# Patient Record
Sex: Male | Born: 1980 | Race: White | Hispanic: No | Marital: Married | Smoking: Former smoker
Health system: Southern US, Community
[De-identification: ages and names within clinical notes are randomized; demographics above are authoritative.]

## PROBLEM LIST (undated history)

## (undated) DIAGNOSIS — F32A Depression, unspecified: Secondary | ICD-10-CM

## (undated) DIAGNOSIS — R569 Unspecified convulsions: Secondary | ICD-10-CM

## (undated) DIAGNOSIS — G43909 Migraine, unspecified, not intractable, without status migrainosus: Secondary | ICD-10-CM

## (undated) DIAGNOSIS — F329 Major depressive disorder, single episode, unspecified: Secondary | ICD-10-CM

## (undated) HISTORY — DX: Unspecified convulsions: R56.9

## (undated) HISTORY — DX: Migraine, unspecified, not intractable, without status migrainosus: G43.909

## (undated) HISTORY — DX: Major depressive disorder, single episode, unspecified: F32.9

## (undated) HISTORY — PX: REFRACTIVE SURGERY: SHX103

## (undated) HISTORY — DX: Depression, unspecified: F32.A

---

## 2006-12-08 ENCOUNTER — Emergency Department (HOSPITAL_COMMUNITY): Admission: EM | Admit: 2006-12-08 | Discharge: 2006-12-08 | Payer: Self-pay | Admitting: Emergency Medicine

## 2009-03-18 ENCOUNTER — Emergency Department (HOSPITAL_COMMUNITY): Admission: EM | Admit: 2009-03-18 | Discharge: 2009-03-19 | Payer: Self-pay | Admitting: Emergency Medicine

## 2011-06-04 ENCOUNTER — Emergency Department (HOSPITAL_COMMUNITY): Payer: Managed Care, Other (non HMO)

## 2011-06-04 ENCOUNTER — Other Ambulatory Visit: Payer: Self-pay

## 2011-06-04 ENCOUNTER — Emergency Department (HOSPITAL_COMMUNITY)
Admission: EM | Admit: 2011-06-04 | Discharge: 2011-06-05 | Disposition: A | Discharge status: Home / Self Care | Payer: Managed Care, Other (non HMO) | Attending: Emergency Medicine | Admitting: Emergency Medicine

## 2011-06-04 DIAGNOSIS — R569 Unspecified convulsions: Secondary | ICD-10-CM | POA: Insufficient documentation

## 2011-06-04 DIAGNOSIS — IMO0002 Reserved for concepts with insufficient information to code with codable children: Secondary | ICD-10-CM | POA: Insufficient documentation

## 2011-06-04 DIAGNOSIS — R079 Chest pain, unspecified: Secondary | ICD-10-CM | POA: Insufficient documentation

## 2011-06-04 DIAGNOSIS — X58XXXA Exposure to other specified factors, initial encounter: Secondary | ICD-10-CM | POA: Insufficient documentation

## 2011-06-04 NOTE — ED Notes (Addendum)
Pt states he thinks he had a seizure around 9 pm. Pt states he was sitting talking to his daughter, than he woke up on the floor, states he hit his head. Pt hx: of seizure last one 20 years ago), and epilepsy. Pt also c/o intermittent substernal CP that feels like pressure. Tender to palpation. Lightheadness, and blurry vision. Pain on left shoulder arm, and leg

## 2011-06-04 NOTE — ED Provider Notes (Signed)
History     CSN: [id number]  Arrival date & time 06/04/11  2158   First MD Initiated Contact with Patient 06/04/11 2301      Chief Complaint  Patient presents with  . Seizures    (Consider location/radiation/quality/duration/timing/severity/associated sxs/prior treatment) Patient is a 31 y.o. male presenting with seizures. The history is provided by the patient and the spouse. The history is limited by the condition of the patient. No language interpreter was used.  Seizures  This is a recurrent (Had epilepsy as a child, and was seizure free and off meds for 20 years) problem. The current episode started 1 to 2 hours ago. The problem has been resolved. There was 1 seizure. The most recent episode lasted 30 to 120 seconds. Associated symptoms include chest pain. Pertinent negatives include no headaches. Characteristics include rhythmic jerking and loss of consciousness. thinks it may have been kerking as left side hurts and he was sitting on the floor and awoke 180 degrree turned around and under bed with abrasion to right temple Focality: unknown as was unwitnessed. unknown There has been no fever. Associated symptoms comments: Chest sore in one spot. Meds prior to arrival: none.    No past medical history on file.  No past surgical history on file.  No family history on file.  History  Substance Use Topics  . Smoking status: Not on file  . Smokeless tobacco: Not on file  . Alcohol Use: Not on file      Review of Systems  Constitutional: Negative.   HENT: Negative.   Eyes: Negative.   Respiratory: Negative for shortness of breath.   Cardiovascular: Positive for chest pain.  Gastrointestinal: Negative.   Genitourinary: Negative.   Musculoskeletal: Negative.   Skin: Negative.   Neurological: Positive for seizures and loss of consciousness. Negative for dizziness, facial asymmetry, weakness, light-headedness, numbness and headaches.  Hematological: Negative.     Psychiatric/Behavioral: Negative.     Allergies  Shellfish allergy and Penicillins  Home Medications  No current outpatient prescriptions on file.  BP 131/82  Pulse 61  Temp(Src) 98 F (36.7 C) (Oral)  Resp 18  SpO2 94%  Physical Exam  Constitutional: He is oriented to person, place, and time. He appears well-developed and well-nourished. No distress.  HENT:  Right Ear: No hemotympanum.  Left Ear: No hemotympanum.  Mouth/Throat: Oropharynx is clear and moist. No oropharyngeal exudate.       abrasion linear right temporal area  Eyes: Conjunctivae and EOM are normal. Pupils are equal, round, and reactive to light.  Neck: Normal range of motion. Neck supple.  Cardiovascular: Normal rate and regular rhythm.   Pulmonary/Chest: Effort normal and breath sounds normal. He has no wheezes. He has no rales. He exhibits tenderness.       Left upper chest  Abdominal: Soft. Bowel sounds are normal. There is no tenderness. There is no rebound and no guarding.  Musculoskeletal: Normal range of motion. He exhibits no edema and no tenderness.  Neurological: He is alert and oriented to person, place, and time. He has normal reflexes. No cranial nerve deficit.  Skin: Skin is warm and dry.  Psychiatric: He has a normal mood and affect.    ED Course  Procedures (including critical care time)   Labs Reviewed  CBC  DIFFERENTIAL  URINALYSIS, ROUTINE W REFLEX MICROSCOPIC   No results found.   No diagnosis found.  PERC negative.  No long car trips or plane trips. No swelling nor pain in the  lower extremities.  Very low risk for PE Suspect the chest tenderness is due to hitting the underside of the bed with his chest while seizing.    MDM  Patient is informed he cannot drive for 6 month or until cleared by neurology 117 case d/w Dr. Roseanne Reno of neuro, no meds at this time.  No driving as per EDP prior instructions and follow up in the office for ongoing care.    Patient to return  immediately for chest pain shortness of breath, altered level of consciousness or seizures.  Patient and wife verbal understanding and agree to follow up       Date: 06/05/2011  Rate: 53  Rhythm: sinus bradycardia  QRS Axis: normal  Intervals: normal  ST/T Wave abnormalities: normal  Conduction Disutrbances:none  Narrative Interpretation:   Old EKG Reviewed: changes noted    Tony Petrow Smitty Cords, MD 06/05/11 8295

## 2011-06-04 NOTE — ED Notes (Signed)
Pt states that he has a history of seizures last one 20 years ago when he was 11. Pt was on phenobaritol for a few years and has been off for a few years as well. Pt states that this evening he was sitting in a chair in his daughter's room and felt like he was going to pass out. Pt states that he woke up and was under his daughter's bed. Pt states that his left side is hurting and pt has a new scrap to his right side of his face. Pt is alert and oriented and able to follow commands and move extremities.

## 2011-06-05 ENCOUNTER — Other Ambulatory Visit: Payer: Self-pay

## 2011-06-05 LAB — CBC
HCT: 40.2 % (ref 39.0–52.0)
Hemoglobin: 14.3 g/dL (ref 13.0–17.0)
MCH: 29.8 pg (ref 26.0–34.0)
MCHC: 35.6 g/dL (ref 30.0–36.0)
MCV: 83.8 fL (ref 78.0–100.0)
RBC: 4.8 MIL/uL (ref 4.22–5.81)

## 2011-06-05 LAB — DIFFERENTIAL
Eosinophils Absolute: 0.2 10*3/uL (ref 0.0–0.7)
Eosinophils Relative: 2 % (ref 0–5)
Lymphocytes Relative: 31 % (ref 12–46)
Lymphs Abs: 2.9 10*3/uL (ref 0.7–4.0)
Monocytes Absolute: 0.6 10*3/uL (ref 0.1–1.0)

## 2011-06-05 LAB — URINALYSIS, ROUTINE W REFLEX MICROSCOPIC
Glucose, UA: NEGATIVE mg/dL
Hgb urine dipstick: NEGATIVE
Ketones, ur: NEGATIVE mg/dL
Leukocytes, UA: NEGATIVE
pH: 6.5 (ref 5.0–8.0)

## 2011-06-05 LAB — POCT I-STAT, CHEM 8
Chloride: 104 mEq/L (ref 96–112)
Creatinine, Ser: 0.8 mg/dL (ref 0.50–1.35)
Glucose, Bld: 95 mg/dL (ref 70–99)
Hemoglobin: 14.6 g/dL (ref 13.0–17.0)
Potassium: 4 mEq/L (ref 3.5–5.1)
Sodium: 143 mEq/L (ref 135–145)

## 2011-06-05 LAB — POCT I-STAT TROPONIN I

## 2011-06-05 MED ORDER — TETANUS-DIPHTH-ACELL PERTUSSIS 5-2.5-18.5 LF-MCG/0.5 IM SUSP
0.5000 mL | Freq: Once | INTRAMUSCULAR | Status: AC
  Administered 2011-06-05: 0.5 mL via INTRAMUSCULAR
  Filled 2011-06-05: qty 0.5

## 2011-06-05 NOTE — Discharge Instructions (Signed)
Driving and Equipment Restrictions Some medical problems make it dangerous to drive, ride a bike, or use machines. Some of these problems are:  A hard blow to the head (concussion).   Passing out (fainting).   Twitching and shaking (seizures).   Low blood sugar.   Taking medicine to help you relax (sedatives).   Taking pain medicines.   Wearing an eye patch.   Wearing splints. This can make it hard to use parts of your body that you need to drive safely.  HOME CARE   Do not drive until your doctor says it is okay.   Do not use machines until your doctor says it is okay.  You may need a form signed by your doctor (medical release) before you can drive again. You may also need this form before you do other tasks where you need to be fully alert. MAKE SURE YOU:  Understand these instructions.   Will watch your condition.   Will get help right away if you are not doing well or get worse.  Document Released: 04/07/2004 Document Revised: 02/17/2011 Document Reviewed: 07/08/2009 Exit[employer] Patient Information 2012 [employer].Epilepsy People with epilepsy have times when they shake and jerk uncontrollably (seizures). This happens when there is a sudden change in brain function. Epilepsy may have many possible causes. Anything that disturbs the normal pattern of brain cell activity can lead to seizures. HOME CARE   Listen to your doctor about driving and safety during normal activities.   Only take medicine as told by your doctor.   Take blood tests as told by your doctor.   Tell the people you live and work with that you have seizures. Make sure they know how to help you. They should:   Cushion your head and body.   Turn you on your side.   Not restrain you.   Not place anything inside your mouth.   Call for local emergency medical help if there is any question about what has happened.   Write down when your seizures happen and what you remember about each seizure.  Write down anything you think may have caused the seizure to happen (trigger).   Keep all follow-up visits with your doctor. This is very important.  GET HELP RIGHT AWAY IF:   You get an infection or start to feel sick. You may have more seizures when you are sick.   You are having seizures more often.   Your seizure pattern is changing.   A seizure does not stop after a few seconds or minutes.   A seizure causes you to have trouble breathing.   A seizure gives you a very bad headache.   A seizure makes you unable to speak or use a part of your body.  MAKE SURE YOU:   Understand these instructions.   Will watch your condition.   Will get help right away if you are not doing well or get worse.  Document Released: 12/26/2008 Document Revised: 02/17/2011 Document Reviewed: 12/26/2008 Exit[employer] Patient Information 2012 [employer]. 

## 2011-06-05 NOTE — ED Notes (Signed)
Pt told that he could not drive for 6 month due to seizure like activity until Neurologist clears pt.

## 2012-07-04 ENCOUNTER — Telehealth: Payer: Self-pay | Admitting: *Deleted

## 2012-07-04 NOTE — Telephone Encounter (Signed)
Message copied by Harlon Flor Breaker Springer L on Wed Jul 04, 2012 10:54 AM ------      Message from: Arther Abbott B      Created: Wed Jul 04, 2012 10:35 AM      Contact: Pt [name]       Pt Tony Valentine called has an Appt with GNP Darrol Angel on 08/01/12 for OV. He starts a new job May 1st and will not have insurance coverage. He needs to see if he can get an appt. With the Dr. before the end of the month. I did not cancel the other appt yet and I did let him know that to see what someone else can help him with. Thanks  ------

## 2012-07-04 NOTE — Telephone Encounter (Signed)
sched sooner appt

## 2012-07-06 ENCOUNTER — Ambulatory Visit (INDEPENDENT_AMBULATORY_CARE_PROVIDER_SITE_OTHER): Payer: Managed Care, Other (non HMO) | Admitting: Nurse Practitioner

## 2012-07-06 ENCOUNTER — Encounter: Payer: Self-pay | Admitting: Nurse Practitioner

## 2012-07-06 VITALS — BP 118/69 | HR 82 | Ht 69.5 in | Wt 180.0 lb

## 2012-07-06 DIAGNOSIS — G40209 Localization-related (focal) (partial) symptomatic epilepsy and epileptic syndromes with complex partial seizures, not intractable, without status epilepticus: Secondary | ICD-10-CM | POA: Insufficient documentation

## 2012-07-06 MED ORDER — LAMOTRIGINE 100 MG PO TABS: 150.0000 mg | ORAL_TABLET | Freq: Two times a day (BID) | ORAL | Status: DC

## 2012-07-06 NOTE — Progress Notes (Signed)
HPI: Tony Valentine Age returns for followup at her last visit 02/01/2012. He has a history of complex partial seizure disorder and is currently on lamotrigine 150 twice a day Date of last seizure November 2013. Denies staring spells, confusion, sleep disturbances, lapses of time, headache and bowel and bladder incontinence. 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 His EEG was normal. He is tolerating his lamotrigine without side effects.   ROS:  - blurred vision, headache  Physical Exam General: well developed, well nourished, seated, in no evident distress Head: head normocephalic and atraumatic. Oropharynx benign Neck: supple with no carotid or supraclavicular bruits Cardiovascular: regular rate and rhythm, no murmurs  Neurologic Exam Mental Status: Awake and fully alert. Oriented to place and time. Recent and remote memory intact. Attention span, concentration and fund of knowledge appropriate. Mood and affect appropriate.  Cranial Nerves: Fundoscopic exam reveals sharp disc margins. Pupils equal, briskly reactive to light. Extraocular movements full without nystagmus. Visual fields full to confrontation. Hearing intact and symmetric to finger snap. Facial sensation intact. Face, tongue, palate move normally and symmetrically. Neck flexion and extension normal.  Motor: Normal bulk and tone. Normal strength in all tested extremity muscles. Sensory.: intact to touch and pinprick and vibratory.  Coordination: Rapid alternating movements normal in all extremities. Finger-to-nose and heel-to-shin performed accurately bilaterally. Gait and Station: Arises from chair without difficulty. Stance is normal. Gait demonstrates normal stride length and balance . Able to heel, toe and tandem walk without difficulty.  Reflexes: 2+ and symmetric. Toes downgoing.     ASSESSMENT: Complex partial seizure  disorder doing well on lamotrigine 150 mg twice daily.  PLAN: Continue Lamictal 150 twice a day Will renew for  three-month prescription today.  Patient loses insurance on [id number]. Given information on patient assistance Followup in 6 months Call for any seizure activity  Nilda Riggs, GNP-BC APRN

## 2012-07-06 NOTE — Patient Instructions (Addendum)
Continue Lamictal 150 twice a day Will renew for her three-month prescription today Followup in 6 months Call for any seizure activity

## 2012-08-01 ENCOUNTER — Ambulatory Visit: Payer: Self-pay | Admitting: Nurse Practitioner

## 2012-11-05 ENCOUNTER — Telehealth: Payer: Self-pay | Admitting: Neurology

## 2012-11-05 MED ORDER — LAMOTRIGINE 100 MG PO TABS: 150.0000 mg | ORAL_TABLET | Freq: Two times a day (BID) | ORAL | Status: DC

## 2012-11-05 NOTE — Telephone Encounter (Signed)
Rx sent 

## 2012-12-05 ENCOUNTER — Other Ambulatory Visit: Payer: Self-pay | Admitting: Neurology

## 2013-01-07 ENCOUNTER — Encounter: Payer: Self-pay | Admitting: Nurse Practitioner

## 2013-01-07 ENCOUNTER — Ambulatory Visit (INDEPENDENT_AMBULATORY_CARE_PROVIDER_SITE_OTHER): Payer: PRIVATE HEALTH INSURANCE | Admitting: Nurse Practitioner

## 2013-01-07 ENCOUNTER — Encounter (INDEPENDENT_AMBULATORY_CARE_PROVIDER_SITE_OTHER): Payer: Self-pay

## 2013-01-07 VITALS — BP 112/73 | HR 56 | Ht 69.0 in | Wt 186.0 lb

## 2013-01-07 DIAGNOSIS — G40209 Localization-related (focal) (partial) symptomatic epilepsy and epileptic syndromes with complex partial seizures, not intractable, without status epilepticus: Secondary | ICD-10-CM

## 2013-01-07 DIAGNOSIS — Z79899 Other long term (current) drug therapy: Secondary | ICD-10-CM

## 2013-01-07 DIAGNOSIS — Z5181 Encounter for therapeutic drug level monitoring: Secondary | ICD-10-CM | POA: Insufficient documentation

## 2013-01-07 NOTE — Progress Notes (Signed)
[facility]  PATIENT: Irineo Gaulin DOB: October 16, 1980   REASON FOR VISIT: Followup for seizure disorder  HISTORY OF PRESENT ILLNESS:[name], 32 year old male returns for followup. He has a history of complex partial seizure disorder and is currently on lamotrigine 150 twice a day Date of last seizure November 2013 however he does report occasional dizziness. In further questioning he does not take his medication every 12 hours sometimes he may be 3 hours late. Discussed the importance of taking as directed. Denies staring spells, confusion, sleep disturbances, lapses of time, headache and bowel and bladder incontinence. 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 His EEG was normal. He is tolerating his lamotrigine without side effects.    REVIEW OF SYSTEMS: Full 14 system review of systems performed and notable only for:  Constitutional: N/A  Cardiovascular: N/A  Ear/Nose/Throat: N/A  Skin: N/A  Eyes: N/A  Respiratory: N/A  Gastroitestinal: N/A  Hematology/Lymphatic: N/A  Endocrine: N/A Musculoskeletal:N/A  Allergy/Immunology: N/A  Neurological: Memory loss, headache, dizziness  Psychiatric: Depression   ALLERGIES: Allergies  Allergen Reactions  . Amoxil [Amoxicillin]   . Shellfish Allergy Anaphylaxis  . Penicillins Hives    HOME MEDICATIONS: Outpatient Prescriptions Prior to Visit  Medication Sig Dispense Refill  . lamoTRIgine (LAMICTAL) 100 MG tablet TAKE 1 AND 1/2 TABLETS BY MOUTH 2 TIMES DAILY.  270 tablet  1   No facility-administered medications prior to visit.    PAST MEDICAL HISTORY: Past Medical History  Diagnosis Date  . Seizures   . Migraine     PAST SURGICAL HISTORY: Past Surgical History  Procedure Laterality Date  . Refractive surgery      FAMILY HISTORY: History reviewed. No pertinent family history.  SOCIAL  HISTORY: History   Social History  . Marital Status: Married    Spouse Name: Florentina Addison    Number of Children: 2  . Years of Education: 12   Occupational History  .      [employer]   Social History Main Topics  . Smoking status: Current Every Day Smoker    Types: Cigarettes  . Smokeless tobacco: Current User    Types: Chew  . Alcohol Use: 0.0 oz/week     Comment: every once in a while   . Drug Use: No  . Sexual Activity: Not on file   Other Topics Concern  . Not on file   Social History Narrative   Patient lives at home with his wife Florentina Addison) Patient works full time.   Right handed.   Education. Patient is in college now.   Caffeine- two to four cups daily.     PHYSICAL EXAM  Filed Vitals:   01/07/13 0818  BP: 112/73  Pulse: 56  Height: 5\' 9"  (1.753 m)  Weight: 186 lb (84.369 kg)   Body mass index is 27.45 kg/(m^2).  Generalized: Well developed, in no acute distress  Head: normocephalic and atraumatic,. Oropharynx benign  Neck: Supple, no carotid bruits  Cardiac: Regular rate rhythm, no murmur  Musculoskeletal: No deformity   Neurological examination   Mentation: Alert oriented to time, place, history taking. Follows all commands speech and language fluent  Cranial nerve II-XII: Pupils were equal round reactive to light extraocular movements were full, visual field were full on confrontational test. Facial sensation and strength were normal. hearing was intact to finger rubbing bilaterally. Uvula tongue midline. head turning and shoulder  shrug and were normal and symmetric.Tongue protrusion into cheek strength was normal. Motor: normal bulk and tone, full strength in the BUE, BLE, fine finger movements normal, no pronator drift. No focal weakness Coordination: finger-nose-finger, heel-to-shin bilaterally, no dysmetria Reflexes: Brachioradialis 2/2, biceps 2/2, triceps 2/2, patellar 2/2, Achilles 2/2, plantar responses were flexor bilaterally. Gait and  Station: Rising up from seated position without assistance, normal stance,  moderate stride, good arm swing, smooth turning, able to perform tiptoe, and heel walking without difficulty. Tandem gait stable  DIAGNOSTIC DATA (LABS, IMAGING, TESTING) -None to review  ASSESSMENT AND PLAN  32 y.o. year old male  has a past medical history of Seizures and Migraine. here to followup for seizure disorder. He is currently on Lamictal 150 mg twice daily. He does not take this medication 12 hours apart all the time. Instructed the importance of this.  Pt to continue Lamictal at current dose Will get labs in the am, Lamictal level, CBC, CMP FU in 6 months  Nilda Riggs, [facility], [facility], APRN  [facility] [address] [place], [place] [address] [phone]

## 2013-01-07 NOTE — Patient Instructions (Addendum)
Pt to continue Lamictal at current dose Will get labs in the am FU in 6 months

## 2013-01-08 ENCOUNTER — Other Ambulatory Visit (INDEPENDENT_AMBULATORY_CARE_PROVIDER_SITE_OTHER): Payer: Self-pay

## 2013-01-08 DIAGNOSIS — Z0289 Encounter for other administrative examinations: Secondary | ICD-10-CM

## 2013-01-08 DIAGNOSIS — G40209 Localization-related (focal) (partial) symptomatic epilepsy and epileptic syndromes with complex partial seizures, not intractable, without status epilepticus: Secondary | ICD-10-CM

## 2013-01-08 DIAGNOSIS — Z79899 Other long term (current) drug therapy: Secondary | ICD-10-CM

## 2013-01-09 LAB — COMPREHENSIVE METABOLIC PANEL
ALT: 14 IU/L (ref 0–44)
Albumin/Globulin Ratio: 2.3 (ref 1.1–2.5)
BUN: 16 mg/dL (ref 6–20)
Calcium: 9.7 mg/dL (ref 8.7–10.2)
GFR calc non Af Amer: 79 mL/min/{1.73_m2} (ref >59)
Glucose: 100 mg/dL — ABNORMAL HIGH (ref 65–99)
Potassium: 4.1 mmol/L (ref 3.5–5.2)
Total Protein: 6.6 g/dL (ref 6.0–8.5)

## 2013-01-09 LAB — CBC WITH DIFFERENTIAL/PLATELET
Basophils Absolute: 0 10*3/uL (ref 0.0–0.2)
Eosinophils Absolute: 0.1 10*3/uL (ref 0.0–0.4)
HCT: 42.4 % (ref 37.5–51.0)
Immature Grans (Abs): 0 10*3/uL (ref 0.0–0.1)
Immature Granulocytes: 0 %
MCHC: 34.9 g/dL (ref 31.5–35.7)
Monocytes Absolute: 0.4 10*3/uL (ref 0.1–0.9)
Monocytes: 6 %
RDW: 14 % (ref 12.3–15.4)

## 2013-01-09 LAB — LAMOTRIGINE LEVEL: Lamotrigine Lvl: 6.6 ug/mL (ref 2.0–20.0)

## 2013-01-10 NOTE — Progress Notes (Signed)
Quick Note:  Shared good labs with patient thru VM message. ______

## 2013-03-06 ENCOUNTER — Encounter: Payer: Self-pay | Admitting: Nurse Practitioner

## 2013-06-08 ENCOUNTER — Other Ambulatory Visit: Payer: Self-pay | Admitting: Neurology

## 2013-07-08 ENCOUNTER — Ambulatory Visit: Payer: PRIVATE HEALTH INSURANCE | Admitting: Nurse Practitioner

## 2013-07-08 ENCOUNTER — Other Ambulatory Visit: Payer: Self-pay | Admitting: Neurology

## 2013-07-11 ENCOUNTER — Encounter: Payer: Self-pay | Admitting: Nurse Practitioner

## 2013-07-11 ENCOUNTER — Encounter (INDEPENDENT_AMBULATORY_CARE_PROVIDER_SITE_OTHER): Payer: Self-pay

## 2013-07-11 ENCOUNTER — Ambulatory Visit (INDEPENDENT_AMBULATORY_CARE_PROVIDER_SITE_OTHER): Payer: PRIVATE HEALTH INSURANCE | Admitting: Nurse Practitioner

## 2013-07-11 VITALS — BP 112/65 | HR 64 | Ht 67.0 in | Wt 177.0 lb

## 2013-07-11 DIAGNOSIS — G40209 Localization-related (focal) (partial) symptomatic epilepsy and epileptic syndromes with complex partial seizures, not intractable, without status epilepticus: Secondary | ICD-10-CM

## 2013-07-11 MED ORDER — LAMOTRIGINE 100 MG PO TABS: 150.0000 mg | ORAL_TABLET | Freq: Two times a day (BID) | ORAL | Status: DC

## 2013-07-11 NOTE — Progress Notes (Signed)
[facility]  PATIENT: Tony LoserChristopher Valentine DOB: 06/01/1980   REASON FOR VISIT: follow up for seizure disorder   HISTORY OF PRESENT ILLNESS: [name] 33 year old male returns for followup. He was last seen in the office 01/07/2013. He has a complex partial seizure disorder, last seizure occurred in November 2013. He is currently on Lamictal 150 mg twice daily. He denies any staring spells, confusion, sleep disturbances lapses of time, etc. He returns for reevaluation   HISTORY: of complex partial seizure disorder and is currently on lamotrigine 150 twice a day Date of last seizure November 2013 however he does report occasional dizziness. In further questioning he does not take his medication every 12 hours sometimes he may be 3 hours late. Discussed the importance of taking as directed. Denies staring spells, confusion, sleep disturbances, lapses of time, headache and bowel and bladder incontinence. 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 His EEG was normal. He is tolerating his lamotrigine without side effects.   REVIEW OF SYSTEMS: Full 14 system review of systems performed and notable only for those listed, all others are neg:  Constitutional: N/A  Cardiovascular: N/A  Ear/Nose/Throat: N/A  Skin: N/A  Eyes: N/A  Respiratory: N/A  Gastroitestinal: N/A  Hematology/Lymphatic: N/A  Endocrine: N/A Musculoskeletal:N/A  Allergy/Immunology: N/A  Neurological: N/A Psychiatric: N/A   ALLERGIES: Allergies  Allergen Reactions  . Amoxil [Amoxicillin]   . Shellfish Allergy Anaphylaxis  . Penicillins Hives    HOME MEDICATIONS: Outpatient Prescriptions Prior to Visit  Medication Sig Dispense Refill  . lamoTRIgine (LAMICTAL) 100 MG tablet TAKE 1 AND 1/2 TABLETS BY MOUTH 2 TIMES DAILY.  270 tablet  0   No facility-administered medications prior to visit.     PAST MEDICAL HISTORY: Past Medical History  Diagnosis Date  . Seizures   . Migraine     PAST SURGICAL HISTORY: Past Surgical History  Procedure Laterality Date  . Refractive surgery      FAMILY HISTORY: History reviewed. No pertinent family history.  SOCIAL HISTORY: History   Social History  . Marital Status: Married    Spouse Name: Tony Valentine    Number of Children: 2  . Years of Education: 12   Occupational History  .      [employer]   Social History Main Topics  . Smoking status: Former Smoker    Types: Cigarettes  . Smokeless tobacco: Current User    Types: Chew  . Alcohol Use: 0.0 oz/week     Comment: every once in a while   . Drug Use: No  . Sexual Activity: Not on file   Other Topics Concern  . Not on file   Social History Narrative   Patient lives at home with his wife Tony Addison(keren) Patient works full time.   Right handed.   Education. Patient is in college now.   Caffeine- two to four cups daily.   Patient has 2 children.           PHYSICAL EXAM  Filed Vitals:   07/11/13 0826  BP: 112/65  Pulse: 64  Height: 5\' 7"  (1.702 m)  Weight: 177 lb (80.287 kg)   Body mass index is 27.72 kg/(m^2).  Generalized: Well developed, in no acute distress   Neurological examination   Mentation: Alert oriented to time, place, history taking. Follows all commands speech and language fluent  Cranial nerve II-XII: Pupils were equal round reactive  to light extraocular movements were full, visual field were full on confrontational test. Facial sensation and strength were normal. hearing was intact to finger rubbing bilaterally. Uvula tongue midline. head turning and shoulder shrug were normal and symmetric.Tongue protrusion into cheek strength was normal. Motor: normal bulk and tone, full strength in the BUE, BLE, fine finger movements normal, no pronator drift. No focal weakness Coordination: finger-nose-finger, heel-to-shin bilaterally, no  dysmetria Reflexes: Brachioradialis 2/2, biceps 2/2, triceps 2/2, patellar 2/2, Achilles 2/2, plantar responses were flexor bilaterally. Gait and Station: Rising up from seated position without assistance, normal stance,  moderate stride, good arm swing, smooth turning, able to perform tiptoe, and heel walking without difficulty. Tandem gait is steady  DIAGNOSTIC DATA (LABS, IMAGING, TESTING) - I reviewed patient records, labs, notes, testing and imaging myself where available.  Lab Results  Component Value Date   WBC 5.6 01/08/2013   HGB 14.8 01/08/2013   HCT 42.4 01/08/2013   MCV 86 01/08/2013   PLT 227 06/05/2011      Component Value Date/Time   NA 141 01/08/2013 0833   NA 143 06/05/2011 0014   K 4.1 01/08/2013 0833   CL 100 01/08/2013 0833   CO2 25 01/08/2013 0833   GLUCOSE 100* 01/08/2013 0833   GLUCOSE 95 06/05/2011 0014   BUN 16 01/08/2013 0833   BUN 15 06/05/2011 0014   CREATININE 1.22 01/08/2013 0833   CALCIUM 9.7 01/08/2013 0833   PROT 6.6 01/08/2013 0833   AST 13 01/08/2013 0833   ALT 14 01/08/2013 0833   ALKPHOS 47 01/08/2013 0833   BILITOT 0.6 01/08/2013 0833   GFRNONAA 79 01/08/2013 0833   GFRAA 91 01/08/2013 0833   ASSESSMENT AND PLAN  33 y.o. year old male  has a past medical history of complex partial seizures currently on Lamictal 150 twice a day. Last seizure was in November 2013  Continue Lamictal at current dose will refill for one year Call for any seizure activity Revisit in one year or sooner if problems arise Nilda RiggsNancy Carolyn Martin, [facility] Center[facility]NP, [facility], APRN  [facility] [address], [address] [place], [place] [id number] [phone]

## 2013-07-11 NOTE — Patient Instructions (Signed)
Continue Lamictal at current dose will refill for one year Call for any seizure activity Revisit in one year or sooner if problems arise

## 2013-08-11 ENCOUNTER — Other Ambulatory Visit: Payer: Self-pay | Admitting: Neurology

## 2014-07-10 ENCOUNTER — Ambulatory Visit (INDEPENDENT_AMBULATORY_CARE_PROVIDER_SITE_OTHER): Payer: PRIVATE HEALTH INSURANCE | Admitting: Nurse Practitioner

## 2014-07-10 ENCOUNTER — Encounter: Payer: Self-pay | Admitting: Nurse Practitioner

## 2014-07-10 VITALS — BP 121/65 | Ht 65.0 in | Wt 192.4 lb

## 2014-07-10 DIAGNOSIS — G40209 Localization-related (focal) (partial) symptomatic epilepsy and epileptic syndromes with complex partial seizures, not intractable, without status epilepticus: Secondary | ICD-10-CM

## 2014-07-10 DIAGNOSIS — G4710 Hypersomnia, unspecified: Secondary | ICD-10-CM

## 2014-07-10 DIAGNOSIS — R400 Somnolence: Secondary | ICD-10-CM | POA: Insufficient documentation

## 2014-07-10 NOTE — Progress Notes (Signed)
[facility]  PATIENT: Tony LoserChristopher Valentine DOB: 03/19/1980   REASON FOR VISIT: Follow-up for his partial seizure disorder, migraines and new complaint of daytime drowsiness HISTORY FROM: Patient    HISTORY OF PRESENT ILLNESS:Tony Valentine 34 year old male returns for followup. He was last seen in the office 07/11/2013 . He has a complex partial seizure disorder, last seizure occurred in November 2013. He is currently on Lamictal 150 mg twice daily. He denies any staring spells, confusion, sleep disturbances lapses of time, etc. He has a new complaint today of daytime drowsiness fatigue. He does snore at night He returns for reevaluation   HISTORY: of complex partial seizure disorder and is currently on lamotrigine 150 twice a day Date of last seizure November 2013 however he does report occasional dizziness. In further questioning he does not take his medication every 12 hours sometimes he may be 3 hours late. Discussed the importance of taking as directed. Denies staring spells, confusion, sleep disturbances, lapses of time, headache and bowel and bladder incontinence. 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 His EEG was normal. He is tolerating his lamotrigine without side effects.    REVIEW OF SYSTEMS: Full 14 system review of systems performed and notable only for those listed, all others are neg:  Constitutional: Fatigue Cardiovascular: neg Ear/Nose/Throat: neg  Skin: neg Eyes: neg Respiratory: neg Gastroitestinal: neg  Hematology/Lymphatic: neg  Endocrine: neg Musculoskeletal:neg Allergy/Immunology: neg Neurological: Headache Psychiatric: neg Sleep : Daytime drowsiness, snoring, insomnia   ALLERGIES: Allergies  Allergen Reactions  . Amoxil [Amoxicillin]   . Shellfish Allergy Anaphylaxis  . Penicillins Hives    HOME MEDICATIONS: Outpatient  Prescriptions Prior to Visit  Medication Sig Dispense Refill  . lamoTRIgine (LAMICTAL) 100 MG tablet Take 1.5 tablets (150 mg total) by mouth 2 (two) times daily. 270 tablet 3  . lamoTRIgine (LAMICTAL) 100 MG tablet TAKE 1 AND 1/2 TABLETS BY MOUTH 2 TIMES DAILY. 270 tablet 3   No facility-administered medications prior to visit.    PAST MEDICAL HISTORY: Past Medical History  Diagnosis Date  . Seizures   . Migraine     PAST SURGICAL HISTORY: Past Surgical History  Procedure Laterality Date  . Refractive surgery      FAMILY HISTORY: History reviewed. No pertinent family history.  SOCIAL HISTORY: History   Social History  . Marital Status: Married    Spouse Name: Tony AddisonKeren  . Number of Children: 2  . Years of Education: 12   Occupational History  .      [employer]   Social History Main Topics  . Smoking status: Former Smoker    Types: Cigarettes  . Smokeless tobacco: Current User    Types: Chew  . Alcohol Use: 0.0 oz/week     Comment: every once in a while   . Drug Use: No  . Sexual Activity: Not on file   Other Topics Concern  . Not on file   Social History Narrative   Patient lives at home with his wife Tony Valentine(Tony Valentine) Patient works full time.   Right handed.   Education. Patient is in college now.   Caffeine- two to four cups daily.   Patient has 2 children.           PHYSICAL EXAM  Filed Vitals:   07/10/14 1356  BP: 121/65  Pulse: 62  Height: 5\' 5"  (1.651 m)  Weight: 192 lb 6.4 oz (  87.272 kg)   Body mass index is 32.02 kg/(m^2). Generalized: Well developed, obese male in no acute distress mallopatti 3-4 Neck supple neck size 17  Neurological examination   Mentation: Alert oriented to time, place, history taking. Follows all commands speech and language fluent. ESS 16, FSS 43.  Cranial nerve II-XII: Pupils were equal round reactive to light extraocular movements were full, visual field were full on confrontational test. Facial sensation and  strength were normal. hearing was intact to finger rubbing bilaterally. Uvula tongue midline. head turning and shoulder shrug were normal and symmetric.Tongue protrusion into cheek strength was normal. Motor: normal bulk and tone, full strength in the BUE, BLE, fine finger movements normal, no pronator drift. No focal weakness Coordination: finger-nose-finger, heel-to-shin bilaterally, no dysmetria Reflexes: Brachioradialis 2/2, biceps 2/2, triceps 2/2, patellar 2/2, Achilles 2/2, plantar responses were flexor bilaterally. Gait and Station: Rising up from seated position without assistance, normal stance, moderate stride, good arm swing, smooth turning, able to perform tiptoe, and heel walking without difficulty. Tandem gait is steady   DIAGNOSTIC DATA (LABS, IMAGING, TESTING) -   ASSESSMENT AND PLAN  34 y.o. year old male  has a past medical history of Seizures and Migraine. a new complaint of daytime somnolence.  Continue Lamictal at current dose will refill for one year Call for any seizure activity Recommend sleep study due to daytime drowsiness, neck 17, BMI 32.02, ESS 16 and FSS43 I spent 15 additional minutes in  total face to face time with the patient more than 50% of which was spent counseling and coordination of care,  discussing and reviewing the diagnosis of OSA and further treatment options.I explained in particular the risks and ramifications of untreated moderate to severe OSA, especially with respect to cardiovascular disease  including congestive heart failure, difficult to treat hypertension, cardiac arrhythmias, or stroke. Even type 2 diabetes has, in part, been linked to untreated OSA. Symptoms of untreated OSA include daytime sleepiness, memory problems, mood irritability and mood disorder such as depression and anxiety, lack of energy, as well as recurrent headaches, especially morning headaches. We talked about trying to maintain a healthy lifestyle in general, as well as  the importance of weight control. I encouraged the patient to eat healthy, exercise daily and keep well hydrated, to keep a scheduled bedtime and wake time routine, to not skip any meals and eat healthy snacks in between meals. He is agreeable to having the study  You will f/u with sleep physician if positive study F/U with me in 1 year Nilda Riggs, [facility], [facility], APRN  [facility] [address], [address] [place], [place] [address] [phone]

## 2014-07-10 NOTE — Patient Instructions (Addendum)
Continue Lamictal at current dose will refill for one year Call for any seizure activity Recommend sleep study due to daytime drowsiness, neck 17, BMI 32.02, ESS 16 and FSS43 You will f/u with sleep physician if positive study F/U with me in 1 year

## 2014-07-11 NOTE — Progress Notes (Signed)
I have reviewed and agreed above plan. 

## 2014-07-14 ENCOUNTER — Other Ambulatory Visit: Payer: Self-pay | Admitting: Nurse Practitioner

## 2014-07-14 ENCOUNTER — Ambulatory Visit: Payer: PRIVATE HEALTH INSURANCE | Admitting: Nurse Practitioner

## 2014-08-04 ENCOUNTER — Ambulatory Visit (INDEPENDENT_AMBULATORY_CARE_PROVIDER_SITE_OTHER): Payer: PRIVATE HEALTH INSURANCE | Admitting: Podiatry

## 2014-08-04 ENCOUNTER — Encounter: Payer: Self-pay | Admitting: Podiatry

## 2014-08-04 VITALS — BP 121/65 | HR 65 | Temp 99.2°F

## 2014-08-04 DIAGNOSIS — L309 Dermatitis, unspecified: Secondary | ICD-10-CM

## 2014-08-04 MED ORDER — TERBINAFINE HCL 250 MG PO TABS: 250.0000 mg | ORAL_TABLET | Freq: Every day | ORAL | Status: DC

## 2014-08-04 MED ORDER — METHYLPREDNISOLONE 4 MG PO TBPK: ORAL_TABLET | ORAL | Status: DC

## 2014-08-04 NOTE — Progress Notes (Signed)
   Subjective:    Patient ID: Tony Valentine, male    DOB: 12/26/1980, 34 y.o.   MRN: [id number]  HPI Patient presents here today "B/L feet pain, with a rash and redness since a year ago but comes and goes about 2-3 months at a time." he tried antifungal cream, peroxide and neosporin, lyme and salt(scrubbed), did not help but the  Cortisone for ezema did help some.   Review of Systems  Skin: Positive for color change and rash.  Allergic/Immunologic: Positive for food allergies.  Neurological: Positive for seizures.  Hematological:       Slow to heal       Objective:   Physical Exam        Assessment & Plan:

## 2014-08-05 NOTE — Progress Notes (Signed)
Subjective:     Patient ID: Tony Valentine, male   DOB: 06/11/1980, 34 y.o.   MRN: [id number]  HPI patient presents stating that he has a red cervical on top of his foot right over left and that it appeared last year it lasted for a while he was on cement topical medicine and then it came back again now. Has a history also on his arms of having this and has had a previous diagnosis of eczema   Review of Systems  All other systems reviewed and are negative.      Objective:   Physical Exam  Constitutional: He is oriented to person, place, and time.  Cardiovascular: Intact distal pulses.   Musculoskeletal: Normal range of motion.  Neurological: He is oriented to person, place, and time.  Skin: Skin is warm.  Nursing note and vitals reviewed.  neurovascular status intact with muscle strength adequate range of motion within normal limits. Patient's noted to have cercle on the right dorsum of the foot that's localized with no drainage no erythema or edema associated with it. The left has a small circle that he states will probably get worse over time     Assessment:     Probable eczema condition with possibility for fungal infiltration    Plan:     H&P and education rendered to patient and recommended that he see a dermatologist. Today I did go ahead and I placed him on a six-day sterile a pack to be followed by Lamisil treatment and he will be seen back if symptoms were to get worse but he is encouraged to see a dermatologist which she promises to do in the very near future

## 2015-07-09 ENCOUNTER — Encounter: Payer: Self-pay | Admitting: Nurse Practitioner

## 2015-07-09 ENCOUNTER — Ambulatory Visit (INDEPENDENT_AMBULATORY_CARE_PROVIDER_SITE_OTHER): Payer: BLUE CROSS/BLUE SHIELD | Admitting: Nurse Practitioner

## 2015-07-09 VITALS — BP 102/70 | HR 55 | Ht 65.0 in | Wt 189.2 lb

## 2015-07-09 DIAGNOSIS — R51 Headache: Secondary | ICD-10-CM | POA: Diagnosis not present

## 2015-07-09 DIAGNOSIS — Z5181 Encounter for therapeutic drug level monitoring: Secondary | ICD-10-CM | POA: Diagnosis not present

## 2015-07-09 DIAGNOSIS — G40209 Localization-related (focal) (partial) symptomatic epilepsy and epileptic syndromes with complex partial seizures, not intractable, without status epilepticus: Secondary | ICD-10-CM | POA: Diagnosis not present

## 2015-07-09 DIAGNOSIS — R519 Headache, unspecified: Secondary | ICD-10-CM | POA: Insufficient documentation

## 2015-07-09 DIAGNOSIS — Z8669 Personal history of other diseases of the nervous system and sense organs: Secondary | ICD-10-CM | POA: Insufficient documentation

## 2015-07-09 MED ORDER — LAMOTRIGINE 100 MG PO TABS: ORAL_TABLET | ORAL | Status: DC

## 2015-07-09 NOTE — Patient Instructions (Signed)
Continue Lamictal at current dose Will check labs today Given information on migraine headaches Keep a record of headaches and return in 2 months

## 2015-07-09 NOTE — Progress Notes (Signed)
[facility]  PATIENT: Tony LoserChristopher Valentine DOB: 04/15/1980   REASON FOR VISIT: Follow-up for epilepsy HISTORY FROM: Patient    HISTORY OF PRESENT ILLNESS:: 07/09/2015 CMMr. Tony Valentine, 35 year old male returns for yearly follow-up, with history of complex partial seizure disorder. Last seizure occurred several weeks ago and lasted about 30 seconds his seizures are episodes of dizziness. He also has a new complaint today of headache and states he has a history of migraines as a child. He is having approximately 1 headache per week starting on the left frontal area and moving back to the neck. He says lack of sleep is a reason for his headaches. He was asked to get a sleep evaluation at his last visit however he did not do that. He has been taking  Advil for migraine. He has not kept a record. He occasionally has photophobia and phonophobia but not always. His headaches may happen on awakening or mid day going to bed. He returns for reevaluation    4/28/16CMMr. Tony Pianonderson 35 year old male returns for followup. He was last seen in the office 07/11/2013 . He has a complex partial seizure disorder, last seizure occurred in November 2013. He is currently on Lamictal 150 mg twice daily. He denies any staring spells, confusion, sleep disturbances lapses of time, etc. He has a new complaint today of daytime drowsiness fatigue. He does snore at night He returns for reevaluation   HISTORY: of complex partial seizure disorder and is currently on lamotrigine 150 twice a day Date of last seizure November 2013 however he does report occasional dizziness. In further questioning he does not take his medication every 12 hours sometimes he may be 3 hours late. Discussed the importance of taking as directed. Denies staring spells, confusion, sleep disturbances, lapses of time, headache and bowel and bladder incontinence. MRI of the brain showed left hippocampal atrophy and left mesial temporal  sclerosis which can be seen in association with temporal lobe epilepsy. His EEG was normal. He is tolerating his lamotrigine without side effects.   REVIEW OF SYSTEMS: Full 14 system review of systems performed and notable only for those listed, all others are neg:  Constitutional: neg  Cardiovascular: neg Ear/Nose/Throat: neg  Skin: neg Eyes: neg Respiratory: neg Gastroitestinal: neg  Hematology/Lymphatic: neg  Endocrine: neg Musculoskeletal:neg Allergy/Immunology: neg Neurological: History of seizure headache dizziness Psychiatric: neg Sleep : Daytime sleepiness insomnia   ALLERGIES: Allergies  Allergen Reactions  . Amoxil [Amoxicillin]   . Shellfish Allergy Anaphylaxis  . Penicillins Hives    HOME MEDICATIONS: Outpatient Prescriptions Prior to Visit  Medication Sig Dispense Refill  . lamoTRIgine (LAMICTAL) 100 MG tablet TAKE ONE AND ONE-HALF TABLETS (150 MG TOTAL) BY MOUTH 2 (TWO) TIMES DAILY. 270 tablet 3  . methylPREDNISolone (MEDROL DOSEPAK) 4 MG TBPK tablet follow package directions 21 tablet 0  . terbinafine (LAMISIL) 250 MG tablet Take 1 tablet (250 mg total) by mouth daily. 30 tablet 1   No facility-administered medications prior to visit.    PAST MEDICAL HISTORY: Past Medical History  Diagnosis Date  . Seizures (HCC)   . Migraine     PAST SURGICAL HISTORY: Past Surgical History  Procedure Laterality Date  . Refractive surgery      FAMILY HISTORY: History reviewed. No pertinent family history.  SOCIAL HISTORY: Social History   Social History  . Marital Status: Married    Spouse Name: [name]  . Number of Children: 2  . Years of Education: 12   Occupational History  .  [employer]   Social History Main Topics  . Smoking status: Former Smoker    Types: Cigarettes  . Smokeless tobacco: Current User    Types: Chew  . Alcohol Use: 0.0 oz/week     Comment: every once in a while   . Drug Use: No  . Sexual Activity: Not on file     Other Topics Concern  . Not on file   Social History Narrative   Patient lives at home with his wife Florentina Addison) Patient works full time.   Right handed.   Education. Patient is in college now.   Caffeine- two to four cups daily.   Patient has 2 children.           PHYSICAL EXAM  Filed Vitals:   07/09/15 0759  BP: 102/70  Pulse: 55  Height:  (1.651 m)  Weight: 189 lb 3.2 oz (85.821 kg)   Body mass index is 31.48 kg/(m^2). Generalized: Well developed, obese male in no acute distress  Neck supple   Neurological examination   Mentation: Alert oriented to time, place, history taking. Follows all commands speech and language fluent.   Cranial nerve II-XII: Pupils were equal round reactive to light extraocular movements were full, visual field were full on confrontational test. Facial sensation and strength were normal. hearing was intact to finger rubbing bilaterally. Uvula tongue midline. head turning and shoulder shrug were normal and symmetric.Tongue protrusion into cheek strength was normal. Motor: normal bulk and tone, full strength in the BUE, BLE, fine finger movements normal, no pronator drift. No focal weakness Coordination: finger-nose-finger, heel-to-shin bilaterally, no dysmetria Reflexes: Brachioradialis 2/2, biceps 2/2, triceps 2/2, patellar 2/2, Achilles 2/2, plantar responses were flexor bilaterally. Gait and Station: Rising up from seated position without assistance, normal stance, moderate stride, good arm swing, smooth turning, able to perform tiptoe, and heel walking without difficulty. Tandem gait is steady   DIAGNOSTIC DATA (LABS, IMAGING, TESTING) -  ASSESSMENT AND PLAN  35 y.o. year old male  has a past medical history of Seizures (HCC) and Migraine. here to follow-up.  Continue Lamictal at current dose will refill Will check labs today, CBC CMP and Lamictal level Given information on migraine headaches Additional 10 minutes spent talking about  migraine triggers in terms of foods sleep deprivation, seasonal triggers weather triggers etc. Keep a record of headaches and return in 2 monthsVst time 25 min Nilda Riggs, [facility], [facility], APRN  [facility] [address] [place], [place] [address] [phone]

## 2015-07-11 LAB — COMPREHENSIVE METABOLIC PANEL
ALK PHOS: 52 IU/L (ref 39–117)
ALT: 14 IU/L (ref 0–44)
AST: 13 IU/L (ref 0–40)
Albumin/Globulin Ratio: 2.9 — ABNORMAL HIGH (ref 1.2–2.2)
Albumin: 4.9 g/dL (ref 3.5–5.5)
BILIRUBIN TOTAL: 0.4 mg/dL (ref 0.0–1.2)
BUN/Creatinine Ratio: 14 (ref 9–20)
BUN: 15 mg/dL (ref 6–20)
CHLORIDE: 105 mmol/L (ref 96–106)
CO2: 23 mmol/L (ref 18–29)
Calcium: 9.6 mg/dL (ref 8.7–10.2)
Creatinine, Ser: 1.11 mg/dL (ref 0.76–1.27)
GFR calc non Af Amer: 87 mL/min/{1.73_m2} (ref >59)
GFR, EST AFRICAN AMERICAN: 100 mL/min/{1.73_m2} (ref >59)
GLUCOSE: 94 mg/dL (ref 65–99)
Globulin, Total: 1.7 g/dL (ref 1.5–4.5)
Potassium: 5.4 mmol/L — ABNORMAL HIGH (ref 3.5–5.2)
Sodium: 143 mmol/L (ref 134–144)
TOTAL PROTEIN: 6.6 g/dL (ref 6.0–8.5)

## 2015-07-11 LAB — CBC WITH DIFFERENTIAL/PLATELET
BASOS ABS: 0 10*3/uL (ref 0.0–0.2)
Basos: 1 %
EOS (ABSOLUTE): 0.1 10*3/uL (ref 0.0–0.4)
Eos: 3 %
Hematocrit: 43.6 % (ref 37.5–51.0)
Hemoglobin: 14.3 g/dL (ref 12.6–17.7)
IMMATURE GRANS (ABS): 0 10*3/uL (ref 0.0–0.1)
IMMATURE GRANULOCYTES: 0 %
LYMPHS: 39 %
Lymphocytes Absolute: 1.9 10*3/uL (ref 0.7–3.1)
MCH: 29.2 pg (ref 26.6–33.0)
MCHC: 32.8 g/dL (ref 31.5–35.7)
MCV: 89 fL (ref 79–97)
Monocytes Absolute: 0.4 10*3/uL (ref 0.1–0.9)
Monocytes: 9 %
NEUTROS PCT: 48 %
Neutrophils Absolute: 2.3 10*3/uL (ref 1.4–7.0)
PLATELETS: 279 10*3/uL (ref 150–379)
RBC: 4.9 x10E6/uL (ref 4.14–5.80)
RDW: 14.1 % (ref 12.3–15.4)
WBC: 4.9 10*3/uL (ref 3.4–10.8)

## 2015-07-11 LAB — LAMOTRIGINE LEVEL: LAMOTRIGINE LVL: 7.3 ug/mL (ref 2.0–20.0)

## 2015-07-13 ENCOUNTER — Telehealth: Payer: Self-pay | Admitting: Nurse Practitioner

## 2015-07-13 NOTE — Progress Notes (Signed)
Quick Note:  Called and left patient a message relaying labs looked good. ______

## 2015-07-13 NOTE — Telephone Encounter (Signed)
-----   Message from Nilda RiggsNancy Carolyn Martin, NP sent at 07/13/2015  6:47 AM EDT ----- Labs look good. Please call the patient

## 2015-07-13 NOTE — Telephone Encounter (Signed)
Called and left message for patient relayed labs looked good. Relayed to patient if he has any questions please give the office a call back.

## 2015-07-13 NOTE — Progress Notes (Signed)
I have reviewed and agreed above plan. 

## 2015-09-21 ENCOUNTER — Encounter: Payer: Self-pay | Admitting: Nurse Practitioner

## 2015-09-21 ENCOUNTER — Ambulatory Visit (INDEPENDENT_AMBULATORY_CARE_PROVIDER_SITE_OTHER): Payer: BLUE CROSS/BLUE SHIELD | Admitting: Nurse Practitioner

## 2015-09-21 VITALS — BP 105/68 | HR 62 | Resp 16 | Ht 65.0 in | Wt 184.0 lb

## 2015-09-21 DIAGNOSIS — R51 Headache: Secondary | ICD-10-CM | POA: Diagnosis not present

## 2015-09-21 DIAGNOSIS — G40209 Localization-related (focal) (partial) symptomatic epilepsy and epileptic syndromes with complex partial seizures, not intractable, without status epilepticus: Secondary | ICD-10-CM

## 2015-09-21 DIAGNOSIS — Z8669 Personal history of other diseases of the nervous system and sense organs: Secondary | ICD-10-CM | POA: Diagnosis not present

## 2015-09-21 DIAGNOSIS — R400 Somnolence: Secondary | ICD-10-CM

## 2015-09-21 DIAGNOSIS — R519 Headache, unspecified: Secondary | ICD-10-CM

## 2015-09-21 DIAGNOSIS — G4710 Hypersomnia, unspecified: Secondary | ICD-10-CM

## 2015-09-21 NOTE — Progress Notes (Signed)
I have reviewed and agreed above plan. 

## 2015-09-21 NOTE — Progress Notes (Signed)
[facility]  PATIENT: Tony Valentine DOB: 05-May-1980   REASON FOR VISIT: Follow-up for complex partial seizure disorder, generalized headaches, history of migraine, and daytime drowsiness HISTORY FROM: Patient and wife    HISTORY OF PRESENT ILLNESS:UPDATE 07/10/2017CM Tony Valentine, 35 year old male returns for follow-up with his wife. He has a history of seizure disorder and migraine headaches last partial seizure occurred in April which lasted about 30 seconds. Last Lamictal level  was 7.3 Headaches are in good control. He did have a migraine last week due to lack of sleep. Wife reports today that he snores and quits breathing. He has been asked to have a sleep study in the past however he did not follow through. He complains of daytime drowsiness. He awakens frequently during the night. He returns for reevaluation   07/09/2015 CMMr. Tony Valentine, 35 year old male returns for yearly follow-up, with history of complex partial seizure disorder. Last seizure occurred several weeks ago and lasted about 30 seconds his seizures are episodes of dizziness. He also has a new complaint today of headache and states he has a history of migraines as a child. He is having approximately 1 headache per week starting on the left frontal area and moving back to the neck. He says lack of sleep is a reason for his headaches. He was asked to get a sleep evaluation at his last visit however he did not do that. He has been taking Advil for migraine. He has not kept a record. He occasionally has photophobia and phonophobia but not always. His headaches may happen on awakening or mid day going to bed. He returns for reevaluation    4/28/16CMMr. Tony Valentine 35 year old male returns for followup. He was last seen in the office 07/11/2013 . He has a complex partial seizure disorder, last seizure occurred in November 2013. He is currently on Lamictal 150 mg twice daily. He denies any staring spells,  confusion, sleep disturbances lapses of time, etc. He has a new complaint today of daytime drowsiness fatigue. He does snore at night He returns for reevaluation   HISTORY: of complex partial seizure disorder and is currently on lamotrigine 150 twice a day Date of last seizure November 2013 however he does report occasional dizziness. In further questioning he does not take his medication every 12 hours sometimes he may be 3 hours late. Discussed the importance of taking as directed. Denies staring spells, confusion, sleep disturbances, lapses of time, headache and bowel and bladder incontinence. 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 His EEG was normal. He is tolerating his lamotrigine without side effects.   REVIEW OF SYSTEMS: Full 14 system review of systems performed and notable only for those listed, all others are neg:  Constitutional: neg  Cardiovascular: neg Ear/Nose/Throat: neg  Skin: neg Eyes: neg Respiratory: neg Gastroitestinal: neg  Hematology/Lymphatic: neg  Endocrine: neg Musculoskeletal:neg Allergy/Immunology: neg Neurological: Headache seizure Psychiatric: neg Sleep : Daytime drowsiness, snoring insomnia   ALLERGIES: Allergies  Allergen Reactions  . Amoxil [Amoxicillin]   . Shellfish Allergy Anaphylaxis  . Penicillins Hives    HOME MEDICATIONS: Outpatient Prescriptions Prior to Visit  Medication Sig Dispense Refill  . lamoTRIgine (LAMICTAL) 100 MG tablet TAKE ONE AND ONE-HALF TABLETS (150 MG TOTAL) BY MOUTH 2 (TWO) TIMES DAILY. 270 tablet 3   No facility-administered medications prior to visit.    PAST MEDICAL HISTORY: Past Medical History  Diagnosis Date  . Seizures (  HCC)   . Migraine     PAST SURGICAL HISTORY: Past Surgical History  Procedure Laterality Date  . Refractive surgery      FAMILY HISTORY: No family history on file.  SOCIAL  HISTORY: Social History   Social History  . Marital Status: Married    Spouse Name: [name]  . Number of Children: 2  . Years of Education: 12   Occupational History  .      [employer]   Social History Main Topics  . Smoking status: Former Smoker    Types: Cigarettes  . Smokeless tobacco: Current User    Types: Chew  . Alcohol Use: 0.0 oz/week     Comment: every once in a while   . Drug Use: No  . Sexual Activity: Not on file   Other Topics Concern  . Not on file   Social History Narrative   Patient lives at home with his wife Florentina Addison(keren) Patient works full time.   Right handed.   Education. Patient is in college now.   Caffeine- two to four cups daily.   Patient has 2 children.           PHYSICAL EXAM  Filed Vitals:   09/21/15 0751  BP: 105/68  Pulse: 62  Resp: 16  Height: 5\' 5"  (1.651 m)  Weight: 184 lb (83.462 kg)   Body mass index is 30.62 kg/(m^2). Generalized: Well developed, obese male in no acute distress  Neck supple Neck circumference 16  Neurological examination   Mentation: Alert oriented to time, place, history taking. Follows all commands speech and language fluent. ESS 15. FSS 36  Cranial nerve II-XII: Pupils were equal round reactive to light extraocular movements were full, visual field were full on confrontational test. Facial sensation and strength were normal. hearing was intact to finger rubbing bilaterally. Uvula tongue midline. head turning and shoulder shrug were normal and symmetric.Tongue protrusion into cheek strength was normal. Motor: normal bulk and tone, full strength in the BUE, BLE, fine finger movements normal, no pronator drift. No focal weakness Coordination: finger-nose-finger, heel-to-shin bilaterally, no dysmetria Reflexes: Brachioradialis 2/2, biceps 2/2, triceps 2/2, patellar 2/2, Achilles 2/2, plantar responses were flexor bilaterally. Gait and Station: Rising up from seated position without assistance, normal  stance, moderate stride, good arm swing, smooth turning, able to perform tiptoe, and heel walking without difficulty. Tandem gait is steady  DIAGNOSTIC DATA (LABS, IMAGING, TESTING) - I reviewed patient records, labs, notes, testing and imaging myself where available.  Lab Results  Component Value Date   WBC 4.9 07/09/2015   HGB 14.8 01/08/2013   HCT 43.6 07/09/2015   MCV 89 07/09/2015   PLT 279 07/09/2015      Component Value Date/Time   NA 143 07/09/2015 0836   NA 143 06/05/2011 0014   K 5.4* 07/09/2015 0836   CL 105 07/09/2015 0836   CO2 23 07/09/2015 0836   GLUCOSE 94 07/09/2015 0836   GLUCOSE 95 06/05/2011 0014   BUN 15 07/09/2015 0836   BUN 15 06/05/2011 0014   CREATININE 1.11 07/09/2015 0836   CALCIUM 9.6 07/09/2015 0836   PROT 6.6 07/09/2015 0836   ALBUMIN 4.9 07/09/2015 0836   AST 13 07/09/2015 0836   ALT 14 07/09/2015 0836   ALKPHOS 52 07/09/2015 0836   BILITOT 0.4 07/09/2015 0836   BILITOT 0.6 01/08/2013 0833   GFRNONAA 87 07/09/2015 0836   GFRAA 100 07/09/2015 0836    ASSESSMENT AND PLAN 35 y.o. year old male  has a past medical history of Seizures (HCC) and Migraine and  daytime drowsiness here to follow-up.  Continue Lamictal at current dose  Set up for sleep evaluation and study I explained in particular the risks and ramifications of untreated moderate to severe OSA, especially with respect to cardiovascular disease  including congestive heart failure, difficult to treat hypertension, cardiac arrhythmias, or stroke. Even type 2 diabetes has, in part, been linked to untreated OSA. Symptoms of untreated OSA include daytime sleepiness, memory problems, mood irritability and mood disorder such as depression and anxiety, lack of energy, as well as recurrent headaches, especially morning headaches. We talked about trying to maintain a healthy lifestyle in general, as well as the importance of weight control. I encouraged the patient to eat healthy, exercise daily  and keep well hydrated, to keep a scheduled bedtime and wake time routine, to not skip any meals and eat healthy snacks in between meals Follow-up with me in 6 monthsVst time 30 min Nilda Riggs, [facility], [facility], APRN  [facility] [address] [place], [place] [address] [phone]

## 2015-09-21 NOTE — Patient Instructions (Signed)
Continue Lamictal at current dose Set up for sleep evaluation and study Follow-up with me in 6 months

## 2015-09-23 ENCOUNTER — Encounter: Payer: Self-pay | Admitting: Neurology

## 2015-09-23 ENCOUNTER — Ambulatory Visit (INDEPENDENT_AMBULATORY_CARE_PROVIDER_SITE_OTHER): Payer: BLUE CROSS/BLUE SHIELD | Admitting: Neurology

## 2015-09-23 VITALS — BP 112/72 | HR 70 | Resp 16 | Ht 65.0 in | Wt 182.0 lb

## 2015-09-23 DIAGNOSIS — G4710 Hypersomnia, unspecified: Secondary | ICD-10-CM

## 2015-09-23 DIAGNOSIS — R0681 Apnea, not elsewhere classified: Secondary | ICD-10-CM

## 2015-09-23 DIAGNOSIS — G40209 Localization-related (focal) (partial) symptomatic epilepsy and epileptic syndromes with complex partial seizures, not intractable, without status epilepticus: Secondary | ICD-10-CM | POA: Diagnosis not present

## 2015-09-23 DIAGNOSIS — R51 Headache: Secondary | ICD-10-CM

## 2015-09-23 DIAGNOSIS — R0683 Snoring: Secondary | ICD-10-CM

## 2015-09-23 DIAGNOSIS — R519 Headache, unspecified: Secondary | ICD-10-CM

## 2015-09-23 NOTE — Progress Notes (Signed)
Subjective:    Patient ID: Tony Valentine is a 35 y.o. male.  HPI     Tony Foley, MD, PhD [facility] [address], [address] [address] [place], [place] [address]  Dear Vivia Ewing and Eber Jones,  I saw your patient, Tony Valentine, upon your kind request in my clinic today for initial consultation of his sleep disorder, in particular, concern for underlying obstructive sleep apnea. The patient is unaccompanied today. As you know, Tony Valentine is a 35 year old right-handed gentleman with an underlying medical history of migraine headaches, partial complex seizures, and borderline obesity, who reports snoring, and excessive daytime somnolence. I reviewed your office note from 09/21/2015. He has trouble initiating and maintaining sleep. Per wife, he has had apneic pauses while asleep. His Epworth sleepiness score is 17 out of 24 today, his fatigue score is 38 out of 63. He reports that he has not been a good sleeper first long as he can remember. Lately though especially in the past few weeks he has had more trouble going to sleep and staying asleep. He tries to be in bed around 10 PM each night but it may be up to 3 hours until he can fall asleep. He does occasionally wake up with headaches and takes Advil migraine for that. His wife is noted pauses in his breathing and snoring can be quite loud. He does not recall any family history of OSA. He would be willing to try CPAP if necessary. He is compliant with his medication. He is somewhat evasive about when his last seizure was. He reports that his last major seizure was a few years ago and that he has had smaller seizures in between. He does drink alcohol a few times a week about 1 glass at a time. He drinks 2-3 cups of coffee daily, no sodas, tries to drink water throughout the day. He does not have nocturia and denies any restless leg symptoms. He lives with his wife Clydie Braun and 2 children, ages 45 and 30. He works in  Airline [employer].   His Past Medical History Is Significant For: Past Medical History  Diagnosis Date  . Seizures (HCC)   . Migraine   . Depression     His Past Surgical History Is Significant For: Past Surgical History  Procedure Laterality Date  . Refractive surgery      His Family History Is Significant For: Family History  Problem Relation Age of Onset  . Cancer Maternal Aunt   . Cancer Maternal Uncle   . Cancer Maternal Grandmother     His Social History Is Significant For: Social History   Social History  . Marital Status: Married    Spouse Name: Florentina Addison  . Number of Children: 2  . Years of Education: 12   Occupational History  .      [employer]   Social History Main Topics  . Smoking status: Former Smoker    Types: Cigarettes  . Smokeless tobacco: Current User    Types: Chew  . Alcohol Use: 0.0 oz/week     Comment: every once in a while   . Drug Use: No  . Sexual Activity: Not Asked   Other Topics Concern  . None   Social History Narrative   Patient lives at home with his wife Florentina Addison) Patient works full time.   Right handed.   Education. Patient is in college now.   Caffeine- two to four cups daily.   Patient has 2 children.  His Allergies Are:  Allergies  Allergen Reactions  . Amoxil [Amoxicillin]   . Shellfish Allergy Anaphylaxis  . Penicillins Hives  :   His Current Medications Are:  Outpatient Encounter Prescriptions as of 09/23/2015  Medication Sig  . lamoTRIgine (LAMICTAL) 100 MG tablet TAKE ONE AND ONE-HALF TABLETS (150 MG TOTAL) BY MOUTH 2 (TWO) TIMES DAILY.   No facility-administered encounter medications on file as of 09/23/2015.  :  Review of Systems:  Out of a complete 14 point review of systems, all are reviewed and negative with the exception of these symptoms as listed below:   Review of Systems  Neurological:       Patient has trouble falling and staying asleep, snoring, witnessed apnea, wakes up feeling  tired, morning headaches, daytime tiredness, takes naps during the day.   Epworth Sleepiness Scale 0= would never doze 1= slight chance of dozing 2= moderate chance of dozing 3= high chance of dozing  Sitting and reading:3 Watching TV:2 Sitting inactive in a public place (ex. Theater or meeting):2 As a passenger in a car for an hour without a break:2 Lying down to rest in the afternoon:3 Sitting and talking to someone:1 Sitting quietly after lunch (no alcohol): 3 In a car, while stopped in traffic:1 Total:17   Objective:  Neurologic Exam  Physical Exam Physical Examination:   Filed Vitals:   09/23/15 1258  BP: 112/72  Pulse: 70  Resp: 16    General Examination: The patient is a very pleasant 35 y.o. male in no acute distress. He appears well-developed and well-nourished and well groomed.   HEENT: Normocephalic, atraumatic, pupils are Slightly unequal with left pupil slightly larger than right, to be physiological, both are briskly reactive to light and accommodation. Funduscopic exam is normal with sharp disc margins noted. Extraocular tracking is good without limitation to gaze excursion or nystagmus noted. Normal smooth pursuit is noted. Hearing is grossly intact. Tympanic membranes are clear bilaterally. Face is symmetric with normal facial animation and normal facial sensation. Speech is clear with no dysarthria noted. There is no hypophonia. There is no lip, neck/head, jaw or voice tremor. Neck is supple with full range of passive and active motion. There are no carotid bruits on auscultation. Oropharynx exam reveals: mild mouth dryness, good dental hygiene and moderate airway crowding, due to Larger tongue and large uvula. Tonsils are in place but small. Neck circumference is 15-1/2 inches.he has a mild overbite .   Chest: Clear to auscultation without wheezing, rhonchi or crackles noted.  Heart: S1+S2+0, regular and normal without murmurs, rubs or gallops noted.    Abdomen: Soft, non-tender and non-distended with normal bowel sounds appreciated on auscultation.  Extremities: There is no pitting edema in the distal lower extremities bilaterally. Pedal pulses are intact.  Skin: Warm and dry without trophic changes noted. There are no varicose veins.  Musculoskeletal: exam reveals no obvious joint deformities, tenderness or joint swelling or erythema.   Neurologically:  Mental status: The patient is awake, alert and oriented in all 4 spheres. His immediate and remote memory, attention, language skills and fund of knowledge are appropriate. There is no evidence of aphasia, agnosia, apraxia or anomia. Speech is clear with normal prosody and enunciation. Thought process is linear. Mood is normal and affect is normal.  Cranial nerves II - XII are as described above under HEENT exam. In addition: shoulder shrug is normal with equal shoulder height noted. Motor exam: Normal bulk, strength and tone is noted. There is no drift,  tremor or rebound. Romberg is negative. Reflexes are 1-2+ throughout. Fine motor skills and coordination: intact with normal finger taps, normal hand movements, normal rapid alternating patting, normal foot taps and normal foot agility.  Cerebellar testing: No dysmetria or intention tremor on finger to nose testing. Heel to shin is unremarkable bilaterally. There is no truncal or gait ataxia.  Sensory exam: intact to light touch, pinprick, vibration, temperature sense in the upper and lower extremities.  Gait, station and balance: He stands easily. No veering to one side is noted. No leaning to one side is noted. Posture is age-appropriate and stance is narrow based. Gait shows normal stride length and normal pace. No problems turning are noted. Tandem walk is unremarkable. Intact toe and heel stance is noted.               Assessment and Plan:  In summary, Tony Valentine is a very pleasant 35 y.o.-year old male with an underlying  medical history of migraine headaches, partial complex seizures, and borderline obesity, whose history and physical exam are in keeping with obstructive sleep apnea (OSA). I had a long chat with the patient about my findings and the diagnosis of OSA, its prognosis and treatment options. We talked about medical treatments, surgical interventions and non-pharmacological approaches. I explained in particular the risks and ramifications of untreated moderate to severe OSA, especially with respect to developing cardiovascular disease down the Road, including congestive heart failure, difficult to treat hypertension, cardiac arrhythmias, or stroke. Even type 2 diabetes has, in part, been linked to untreated OSA. Symptoms of untreated OSA include daytime sleepiness, memory problems, mood irritability and mood disorder such as depression and anxiety, lack of energy, as well as recurrent headaches, especially morning headaches. We talked about smokeless tobacco cessation and trying to maintain a healthy lifestyle in general, as well as the importance of weight control. I encouraged the patient to eat healthy, exercise daily and keep well hydrated, to keep a scheduled bedtime and wake time routine, to not skip any meals and eat healthy snacks in between meals. I advised the patient not to drive when feeling sleepy. He is also cautioned about alcohol intake especially since he is on antiepileptic medication. Furthermore, he is advised regarding seizure precautions and no driving for 6 months if he has a seizure.  I recommended the following at this time: sleep study with potential positive airway pressure titration. (We will score hypopneas at 3% and split the sleep study into diagnostic and treatment portion, if the estimated. 2 hour AHI is >15/h).   I explained the sleep test procedure to the patient and also outlined possible surgical and non-surgical treatment options of OSA, including the use of a custom-made dental  device (which would require a referral to a specialist dentist or oral surgeon), upper airway surgical options, such as pillar implants, radiofrequency surgery, tongue base surgery, and UPPP (which would involve a referral to an ENT surgeon). Rarely, jaw surgery such as mandibular advancement may be considered.  I also explained the CPAP treatment option to the patient, who indicated that he would be willing to try CPAP if the need arises. I explained the importance of being compliant with PAP treatment, not only for insurance purposes but primarily to improve His symptoms, and for the patient's long term health benefit, including to reduce His cardiovascular risks. I answered all his questions today and the patient was in agreement. I would like to see him back after the sleep study is completed and  encouraged him to call with any interim questions, concerns, problems or updates.   Thank you very much for allowing me to participate in the care of this nice patient. If I can be of any further assistance to you please do not hesitate to talk to me.   Sincerely,   Tony Foley, MD, PhD

## 2015-09-23 NOTE — Patient Instructions (Signed)

## 2015-10-04 ENCOUNTER — Ambulatory Visit (INDEPENDENT_AMBULATORY_CARE_PROVIDER_SITE_OTHER): Payer: BLUE CROSS/BLUE SHIELD | Admitting: Neurology

## 2015-10-04 DIAGNOSIS — G479 Sleep disorder, unspecified: Secondary | ICD-10-CM

## 2015-10-04 DIAGNOSIS — G4720 Circadian rhythm sleep disorder, unspecified type: Secondary | ICD-10-CM

## 2015-10-04 DIAGNOSIS — G4710 Hypersomnia, unspecified: Secondary | ICD-10-CM | POA: Diagnosis not present

## 2015-10-04 DIAGNOSIS — R0683 Snoring: Secondary | ICD-10-CM

## 2015-10-04 DIAGNOSIS — G4761 Periodic limb movement disorder: Secondary | ICD-10-CM

## 2015-10-05 NOTE — Procedures (Deleted)
Tony Valentine is a 35 y.o. male patient. 1. Hypersomnia, unspecified   2. Snoring   3. Witnessed apneic spells   4. Morning headache   5. Partial symptomatic epilepsy with complex partial seizures, not intractable, without status epilepticus [facility])    Past Medical History:  Diagnosis Date  . Depression   . Migraine   . Seizures (HCC)    There were no vitals taken for this visit.  Procedures  Denton Brick 10/05/2015

## 2015-10-09 ENCOUNTER — Telehealth: Payer: Self-pay | Admitting: Neurology

## 2015-10-09 NOTE — Telephone Encounter (Signed)
Patient referred by Dr. Terrace Arabia and CM, seen by me on 09/23/15, diagnostic PSG on 10/04/15.   Please call and notify the patient that the recent sleep study did not show any significant obstructive sleep apnea, but he had significant leg twitching in sleep, which we tend to see in patients with RLS. Please inform patient that I would like to go over the details of the study during a follow up appointment. Arrange a followup appointment. Also, route or fax report to PCP and referring MD, if other than PCP.  Once you have spoken to patient, you can close this encounter.   Thanks,  Huston Foley, MD, PhD [facility])

## 2015-10-12 NOTE — Telephone Encounter (Signed)
I spoke to patient and he is aware of results and recommendations. We were able to make f/u appt for this week. I will send copy of report to PCP.

## 2015-10-12 NOTE — Telephone Encounter (Signed)
Patient needs to make f/u appt with Dr. Frances Furbish to discuss sleep study

## 2015-10-12 NOTE — Telephone Encounter (Signed)
LM for patient to call back for results

## 2015-10-15 ENCOUNTER — Encounter: Payer: Self-pay | Admitting: Neurology

## 2015-10-15 ENCOUNTER — Ambulatory Visit (INDEPENDENT_AMBULATORY_CARE_PROVIDER_SITE_OTHER): Payer: BLUE CROSS/BLUE SHIELD | Admitting: Neurology

## 2015-10-15 VITALS — BP 118/76 | HR 78 | Resp 16 | Ht 65.0 in | Wt 180.0 lb

## 2015-10-15 DIAGNOSIS — G4710 Hypersomnia, unspecified: Secondary | ICD-10-CM

## 2015-10-15 DIAGNOSIS — R0683 Snoring: Secondary | ICD-10-CM | POA: Diagnosis not present

## 2015-10-15 DIAGNOSIS — G4761 Periodic limb movement disorder: Secondary | ICD-10-CM | POA: Diagnosis not present

## 2015-10-15 NOTE — Patient Instructions (Signed)
Your sleep study was negative for sleep apnea.   Please monitor symptoms for restless, you can address, if needed, with Eber Jones next time.   Please remember to try to maintain good sleep hygiene, which means: Keep a regular sleep and wake schedule, try not to exercise or have a meal within 2 hours of your bedtime, try to keep your bedroom conducive for sleep, that is, cool and dark, without light distractors such as an illuminated alarm clock, and refrain from watching TV right before sleep or in the middle of the night and do not keep the TV or radio on during the night. Also, try not to use or play on electronic devices at bedtime, such as your cell phone, tablet PC or laptop. If you like to read at bedtime on an electronic device, try to dim the background light as much as possible. Do not eat in the middle of the night.   You can continue to use melatonin.

## 2015-10-15 NOTE — Progress Notes (Addendum)
Subjective:    Patient ID: Tony Valentine is a 35 y.o. male.  HPI     Interim history:   Tony Valentine is a 35 year old right-handed gentleman with an underlying medical history of migraine headaches, partial complex seizures, and borderline obesity, who presents for follow-up consultation of his sleep disturbance, after his recent sleep study. The patient is unaccompanied today. Her first met him on 09/23/2015 at the request of Dr. Krista Blue and Cecille Rubin, at which time the patient reported snoring and excessive daytime somnolence and trouble initiating and maintaining sleep. I invited him back for sleep study. He had a baseline sleep study on 10/04/2015. I went over his test results with him in detail today. Sleep efficiency was normal at 90.1% with a latency to sleep of 4 minutes and wake after sleep onset of 39 minutes with minimal to mild sleep fragmentation noted. He had an elevated arousal index, primarily because of periodic leg movements. He had an increased percentage of stage II sleep, slow-wave sleep was 6.8% and a decreased percentage of REM sleep at 11.9% with a borderline prolonged REM latency of 122.5 minutes. He had moderate PLMS with an index of 34 per hour, resulting in 7.9 arousals per hour. He had no significant EKG or EEG changes. Mild to moderate snoring was noted, total AHI normal at 1.7 per hour, average oxygen saturation 96%, nadir was 92%.   Today, 10/15/2015: He reports unchanged symptoms, wife c/o of snoring. Takes occasional melatonin, sometimes it helps and sometimes it doesn't. He takes 5 mg pills. 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 He is also working full-time. He is trying to exercise regularly and continue to work on weight loss. He has occasional joint pains in his legs, but no frank or telltale restless leg symptoms. His wife is not bothered by PLMS but bothered by his snoring.  Previously:   09/23/2015: He reports snoring, and excessive daytime  somnolence. I reviewed your office note from 09/21/2015. He has trouble initiating and maintaining sleep. Per wife, he has had apneic pauses while asleep. His Epworth sleepiness score is 17 out of 24 today, his fatigue score is 38 out of 63. He reports that he has not been a good sleeper first long as he can remember. Lately though especially in the past few weeks he has had more trouble going to sleep and staying asleep. He tries to be in bed around 10 PM each night but it may be up to 3 hours until he can fall asleep. He does occasionally wake up with headaches and takes Advil migraine for that. His wife is noted pauses in his breathing and snoring can be quite loud. He does not recall any family history of OSA. He would be willing to try CPAP if necessary. He is compliant with his medication. He is somewhat evasive about when his last seizure was. He reports that his last major seizure was a few years ago and that he has had smaller seizures in between. He does drink alcohol a few times a week about 1 glass at a time. He drinks 2-3 cups of coffee daily, no sodas, tries to drink water throughout the day. He does not have nocturia and denies any restless leg symptoms. He lives with his wife Tony Valentine and 2 children, ages 33 and 44. He works in [employer].  His Past Medical History Is Significant For: Past Medical History:  Diagnosis Date  . Depression   . Migraine   . Seizures ([place])  His Past Surgical History Is Significant For: Past Surgical History:  Procedure Laterality Date  . REFRACTIVE SURGERY      His Family History Is Significant For: Family History  Problem Relation Age of Onset  . Cancer Maternal Aunt   . Cancer Maternal Uncle   . Cancer Maternal Grandmother     His Social History Is Significant For: Social History   Social History  . Marital status: Married    Spouse name: Laure Kidney  . Number of children: 2  . Years of education: 12   Occupational History  .      [employer]   Social History Main Topics  . Smoking status: Former Smoker    Types: Cigarettes  . Smokeless tobacco: Current User    Types: Chew  . Alcohol use 0.0 oz/week     Comment: every once in a while   . Drug use: No  . Sexual activity: Not Asked   Other Topics Concern  . None   Social History Narrative   Patient lives at home with his wife Laure Kidney) Patient works full time.   Right handed.   Education. Patient is in college now.   Caffeine- two to four cups daily.   Patient has 2 children.          His Allergies Are:  Allergies  Allergen Reactions  . Amoxil [Amoxicillin]   . Shellfish Allergy Anaphylaxis  . Penicillins Hives  :   His Current Medications Are:  Outpatient Encounter Prescriptions as of 10/15/2015  Medication Sig  . lamoTRIgine (LAMICTAL) 100 MG tablet TAKE ONE AND ONE-HALF TABLETS (150 MG TOTAL) BY MOUTH 2 (TWO) TIMES DAILY.   No facility-administered encounter medications on file as of 10/15/2015.   :  Review of Systems:  Out of a complete 14 point review of systems, all are reviewed and negative with the exception of these symptoms as listed below: Review of Systems  Neurological:       Patient is here to discuss his sleep study. No new concerns.     Objective:  Neurologic Exam  Physical Exam Physical Examination:   Vitals:   10/15/15 1033  BP: 118/76  Pulse: 78  Resp: 16    General Examination: The patient is a very pleasant 35 y.o. male in no acute distress. He appears well-developed and well-nourished and well groomed. He is in good spirits today.  HEENT: Normocephalic, atraumatic, pupils are Slightly unequal with left pupil slightly larger than right, to be physiological, both are briskly reactive to light and accommodation. Funduscopic exam is normal with sharp disc margins noted. Extraocular tracking is good without limitation to gaze excursion or nystagmus noted. Normal smooth pursuit is noted. Hearing is grossly intact. Face is  symmetric with normal facial animation and normal facial sensation. Speech is clear with no dysarthria noted. There is no hypophonia. There is no lip, neck/head, jaw or voice tremor. Neck is supple with full range of passive and active motion. There are no carotid bruits on auscultation. Oropharynx exam reveals: mild mouth dryness, good dental hygiene and moderate airway crowding, due to Larger tongue and large uvula. Tonsils are in place but small.   Chest: Clear to auscultation without wheezing, rhonchi or crackles noted.  Heart: S1+S2+0, regular and normal without murmurs, rubs or gallops noted.   Abdomen: Soft, non-tender and non-distended with normal bowel sounds appreciated on auscultation.  Extremities: There is no pitting edema in the distal lower extremities bilaterally. Pedal pulses are intact.  Skin: Warm and dry without trophic changes noted. There are no varicose veins.  Musculoskeletal: exam reveals no obvious joint deformities, tenderness or joint swelling or erythema.   Neurologically:  Mental status: The patient is awake, alert and oriented in all 4 spheres. His immediate and remote memory, attention, language skills and fund of knowledge are appropriate. There is no evidence of aphasia, agnosia, apraxia or anomia. Speech is clear with normal prosody and enunciation. Thought process is linear. Mood is normal and affect is normal.  Cranial nerves II - XII are as described above under HEENT exam. In addition: shoulder shrug is normal with equal shoulder height noted. Motor exam: Normal bulk, strength and tone is noted. There is no drift, tremor or rebound. Romberg is negative. Reflexes are 1-2+ throughout. Fine motor skills and coordination: intact with normal finger taps, normal hand movements, normal rapid alternating patting, normal foot taps and normal foot agility.  Cerebellar testing: No dysmetria or intention tremor on finger to nose testing. Heel to shin is unremarkable  bilaterally.  Sensory exam: intact to light touch, vibration, and temperature sense in the upper and lower extremities.  Gait, station and balance: He stands easily. No veering to one side is noted. No leaning to one side is noted. Posture is age-appropriate and stance is narrow based. Gait shows normal stride length and normal pace. No problems turning are noted. Tandem walk is unremarkable.  Assessment and Plan:  In summary, Emile Kyllo is a very pleasant 35 year old male with an underlying medical history of migraine headaches, partial complex seizures, and borderline obesity, who presents for follow-up consultation after his recent sleep study. Reassuringly, his baseline sleep study from 10/04/2015 showed no significant obstructive sleep apnea, O2 nadir was 92%, remained above or at 92 all night. He had moderate PLMS with mild arousals. We talked about his sleep study results in detail today. He does not have much in the way of restless leg symptoms at this time, he is not bothered by leg twitching at night. Wife has not bothered by his leg movements, but does complain about his snoring. He is advised to sleep on his sides and continue to work on weight loss to help with his snoring. He can continue to monitor for restless leg symptoms. Should he have symptoms or flareup and PLMS, he can address this with either Dr. Krista Blue or Hoyle Sauer. He has an appointment with Hoyle Sauer in January 2018. He is advised to keep the appointment. We talked about maintaining good sleep hygiene today.He is advised to establish care with a primary care physician. He would benefit from routine health checkup and blood work including thyroid function, B12 level, vitamin D level. He is in agreement. Physical exam and neurological exam are stable and he is reassured. I can see him back on an as-needed basis. I answered all his questions today and he was in agreement. I spent 25 minutes in total face-to-face time with the  patient, more than 50% of which was spent in counseling and coordination of care, reviewing test results, reviewing medication and discussing or reviewing the diagnosis of snoring, PLMs, the prognosis and treatment options.

## 2016-03-23 ENCOUNTER — Encounter: Payer: Self-pay | Admitting: Nurse Practitioner

## 2016-03-23 ENCOUNTER — Ambulatory Visit (INDEPENDENT_AMBULATORY_CARE_PROVIDER_SITE_OTHER): Payer: BLUE CROSS/BLUE SHIELD | Admitting: Nurse Practitioner

## 2016-03-23 VITALS — BP 120/71 | HR 72 | Resp 16 | Ht 65.0 in | Wt 190.0 lb

## 2016-03-23 DIAGNOSIS — Z5181 Encounter for therapeutic drug level monitoring: Secondary | ICD-10-CM | POA: Diagnosis not present

## 2016-03-23 DIAGNOSIS — G40209 Localization-related (focal) (partial) symptomatic epilepsy and epileptic syndromes with complex partial seizures, not intractable, without status epilepticus: Secondary | ICD-10-CM | POA: Diagnosis not present

## 2016-03-23 NOTE — Progress Notes (Signed)
[facility]  PATIENT: Tony Valentine DOB: [birth year]   REASON FOR VISIT: Follow-up for complex partial seizure disorder,  HISTORY FROM: Patient   HISTORY OF PRESENT ILLNESS:UPDATE 01/10/2018CM Tony Valentine, 36 year old male returns for follow-up for his seizure disorder. He reports waking up with your one day recently. He had no overt seizure activity. He remains on Lamictal 150 twice daily. He denies missing any doses of his medication. Headaches are in good control. His sleep evaluation was negative for obstructive sleep apnea. He does have some periodic leg movements but they are not troubling to him. He returns for reevaluation   UPDATE 07/10/2017CM Tony Valentine, 36 year old male returns for follow-up with his wife. He has a history of seizure disorder and migraine headaches last partial seizure occurred in April which lasted about 30 seconds. Last Lamictal level  was 7.3 Headaches are in good control. He did have a migraine last week due to lack of sleep. Wife reports today that he snores and quits breathing. He has been asked to have a sleep study in the past however he did not follow through. He complains of daytime drowsiness. He awakens frequently during the night. He returns for reevaluation   07/09/2015 CMMr. Tony Valentine, 36 year old male returns for yearly follow-up, with history of complex partial seizure disorder. Last seizure occurred several weeks ago and lasted about 30 seconds his seizures are episodes of dizziness. He also has a new complaint today of headache and states he has a history of migraines as a child. He is having approximately 1 headache per week starting on the left frontal area and moving back to the neck. He says lack of sleep is a reason for his headaches. He was asked to get a sleep evaluation at his last visit however he did not do that. He has been taking Advil for migraine. He has not kept a record. He occasionally has photophobia and  phonophobia but not always. His headaches may happen on awakening or mid day going to bed. He returns for reevaluation    4/28/16CMMr. Valentine 36 year old male returns for followup. He was last seen in the office 07/11/2013 . He has a complex partial seizure disorder, last seizure occurred in November 2013. He is currently on Lamictal 150 mg twice daily. He denies any staring spells, confusion, sleep disturbances lapses of time, etc. He has a new complaint today of daytime drowsiness fatigue. He does snore at night He returns for reevaluation   HISTORY: of complex partial seizure disorder and is currently on lamotrigine 150 twice a day Date of last seizure November 2013 however he does report occasional dizziness. In further questioning he does not take his medication every 12 hours sometimes he may be 3 hours late. Discussed the importance of taking as directed. Denies staring spells, confusion, sleep disturbances, lapses of time, headache and bowel and bladder incontinence. 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 His EEG was normal. He is tolerating his lamotrigine without side effects.   REVIEW OF SYSTEMS: Full 14 system review of systems performed and notable only for those listed, all others are neg:  Constitutional: neg  Cardiovascular: neg Ear/Nose/Throat: neg  Skin: neg Eyes: neg Respiratory: neg Gastroitestinal: neg  Hematology/Lymphatic: neg  Endocrine: neg Musculoskeletal: Joint pain Allergy/Immunology: neg Neurological: Headache seizure Psychiatric Sleep  snoring    ALLERGIES: Allergies  Allergen Reactions  . Amoxil [Amoxicillin]   . Shellfish Allergy Anaphylaxis  .  Penicillins Hives    HOME MEDICATIONS: Outpatient Medications Prior to Visit  Medication Sig Dispense Refill  . lamoTRIgine (LAMICTAL) 100 MG tablet TAKE ONE AND ONE-HALF TABLETS (150 MG TOTAL) BY  MOUTH 2 (TWO) TIMES DAILY. 270 tablet 3   No facility-administered medications prior to visit.     PAST MEDICAL HISTORY: Past Medical History:  Diagnosis Date  . Depression   . Migraine   . Seizures (HCC)     PAST SURGICAL HISTORY: Past Surgical History:  Procedure Laterality Date  . REFRACTIVE SURGERY      FAMILY HISTORY: Family History  Problem Relation Age of Onset  . Cancer Maternal Aunt   . Cancer Maternal Uncle   . Cancer Maternal Grandmother     SOCIAL HISTORY: Social History   Social History  . Marital status: Married    Spouse name: Tony Valentine  . Number of children: 2  . Years of education: 12   Occupational History  .      [employer]   Social History Main Topics  . Smoking status: Former Smoker    Types: Cigarettes  . Smokeless tobacco: Current User    Types: Chew  . Alcohol use 0.0 oz/week     Comment: every once in a while   . Drug use: No  . Sexual activity: Not on file   Other Topics Concern  . Not on file   Social History Narrative   Patient lives at home with his wife Tony Addison(Tony Valentine) Patient works full time.   Right handed.   Education. Patient is in college now.   Caffeine- two to four cups daily.   Patient has 2 children.           PHYSICAL EXAM  Vitals:   03/23/16 0728  BP: 120/71  Pulse: 72  Resp: 16  Weight: 190 lb (86.2 kg)  Height: 5\' 5"  (1.651 m)   Body mass index is 31.62 kg/m. Generalized: Well developed, obese male in no acute distress  Neck supple   Neurological examination   Mentation: Alert oriented to time, place, history taking. Follows all commands speech and language fluent.  Cranial nerve II-XII: Pupils were equal round reactive to light extraocular movements were full, visual field were full on confrontational test. Facial sensation and strength were normal. hearing was intact to finger rubbing bilaterally. Uvula tongue midline. head turning and shoulder shrug were normal and symmetric.Tongue  protrusion into cheek strength was normal. Motor: normal bulk and tone, full strength in the BUE, BLE, fine finger movements normal, no pronator drift. No focal weakness Coordination: finger-nose-finger, heel-to-shin bilaterally, no dysmetria Reflexes: Brachioradialis 2/2, biceps 2/2, triceps 2/2, patellar 2/2, Achilles 2/2, plantar responses were flexor bilaterally. Gait and Station: Rising up from seated position without assistance, normal stance, moderate stride, good arm swing, smooth turning, able to perform tiptoe, and heel walking without difficulty. Tandem gait is steady  DIAGNOSTIC DATA (LABS, IMAGING, TESTING) - I reviewed patient records, labs, notes, testing and imaging myself where available.  Lab Results  Component Value Date   WBC 4.9 07/09/2015   HGB 14.8 01/08/2013   HCT 43.6 07/09/2015   MCV 89 07/09/2015   PLT 279 07/09/2015      Component Value Date/Time   NA 143 07/09/2015 0836   K 5.4 (H) 07/09/2015 0836   CL 105 07/09/2015 0836   CO2 23 07/09/2015 0836   GLUCOSE 94 07/09/2015 0836   GLUCOSE 95 06/05/2011 0014   BUN 15 07/09/2015 0836   CREATININE  1.11 07/09/2015 0836   CALCIUM 9.6 07/09/2015 0836   PROT 6.6 07/09/2015 0836   ALBUMIN 4.9 07/09/2015 0836   AST 13 07/09/2015 0836   ALT 14 07/09/2015 0836   ALKPHOS 52 07/09/2015 0836   BILITOT 0.4 07/09/2015 0836   GFRNONAA 87 07/09/2015 0836   GFRAA 100 07/09/2015 0836    ASSESSMENT AND PLAN 36 y.o. year old male  has a past medical history of Seizures (HCC) and Migraine and  daytime drowsiness here to follow-up.His sleep study was negative for obstructive sleep apnea although he does have periodic leg movements which are not bothersome to him. Due to his recent waking up with urine in the bed check Lamictal level  Continue Lamictal at current dose for now will refill when labs back Will check Lamictal level Follow-up in 6 months Nilda Riggs, [facility], [facility], APRN  [facility] [address], [address] [place], [place] [address] [phone]

## 2016-03-23 NOTE — Patient Instructions (Signed)
Continue Lamictal at current dose for now Will check Lamictal level Follow-up in 6 months

## 2016-03-25 LAB — LAMOTRIGINE LEVEL: Lamotrigine Lvl: 5.8 ug/mL (ref 2.0–20.0)

## 2016-03-25 NOTE — Progress Notes (Signed)
I have reviewed and agreed above plan. 

## 2016-03-28 ENCOUNTER — Other Ambulatory Visit: Payer: Self-pay | Admitting: Nurse Practitioner

## 2016-03-28 MED ORDER — LAMOTRIGINE 100 MG PO TABS: ORAL_TABLET | ORAL | 3 refills | Status: DC

## 2016-03-29 ENCOUNTER — Telehealth: Payer: Self-pay | Admitting: *Deleted

## 2016-03-29 NOTE — Telephone Encounter (Signed)
Spoke to pt and relayed that his lamictal level in good range.  No change in his medication, to continue current dose.  He verbalized understanding.

## 2016-05-31 DIAGNOSIS — A499 Bacterial infection, unspecified: Secondary | ICD-10-CM | POA: Diagnosis not present

## 2016-05-31 DIAGNOSIS — L409 Psoriasis, unspecified: Secondary | ICD-10-CM | POA: Diagnosis not present

## 2016-06-30 DIAGNOSIS — L409 Psoriasis, unspecified: Secondary | ICD-10-CM | POA: Diagnosis not present

## 2016-09-05 ENCOUNTER — Other Ambulatory Visit: Payer: Self-pay | Admitting: Nurse Practitioner

## 2016-09-19 ENCOUNTER — Telehealth: Payer: Self-pay | Admitting: Nurse Practitioner

## 2016-09-19 ENCOUNTER — Emergency Department (HOSPITAL_COMMUNITY): Payer: BLUE CROSS/BLUE SHIELD

## 2016-09-19 ENCOUNTER — Emergency Department (HOSPITAL_COMMUNITY)
Admission: EM | Admit: 2016-09-19 | Discharge: 2016-09-19 | Disposition: A | Discharge status: Home / Self Care | Payer: BLUE CROSS/BLUE SHIELD | Attending: Emergency Medicine | Admitting: Emergency Medicine

## 2016-09-19 DIAGNOSIS — Z87891 Personal history of nicotine dependence: Secondary | ICD-10-CM | POA: Diagnosis not present

## 2016-09-19 DIAGNOSIS — R569 Unspecified convulsions: Secondary | ICD-10-CM | POA: Diagnosis not present

## 2016-09-19 DIAGNOSIS — R51 Headache: Secondary | ICD-10-CM | POA: Diagnosis not present

## 2016-09-19 DIAGNOSIS — Z79899 Other long term (current) drug therapy: Secondary | ICD-10-CM | POA: Insufficient documentation

## 2016-09-19 DIAGNOSIS — R519 Headache, unspecified: Secondary | ICD-10-CM

## 2016-09-19 DIAGNOSIS — G40909 Epilepsy, unspecified, not intractable, without status epilepticus: Secondary | ICD-10-CM | POA: Diagnosis not present

## 2016-09-19 MED ORDER — OXYCODONE-ACETAMINOPHEN 5-325 MG PO TABS
1.0000 | ORAL_TABLET | ORAL | Status: DC | PRN
  Administered 2016-09-19: 1 via ORAL

## 2016-09-19 MED ORDER — OXYCODONE-ACETAMINOPHEN 5-325 MG PO TABS
ORAL_TABLET | ORAL | Status: AC
  Filled 2016-09-19: qty 1

## 2016-09-19 NOTE — Discharge Instructions (Signed)
It seems likely that your headache and confused episode were related to your seizure disorder. Please follow up with your neurologist to see if they want to do any more testing, and if they want to change your seizure medication.

## 2016-09-19 NOTE — ED Triage Notes (Signed)
Pt reports sudden onset of severe left sided headache that began about 930. By [id number] patient began puking, c/o dizziness. Reports hx of migraines but this feels different. Pt unable to open eyes due to pain. Pt oriented x4, ambulatory. Follows commands.

## 2016-09-19 NOTE — ED Provider Notes (Signed)
[facility]-EMERGENCY DEPT Provider Note   CSN: [id number] Arrival date & time: 09/19/16  1155     History   Chief Complaint Chief Complaint  Patient presents with  . Headache    HPI Tony Valentine is a 36 y.o. male.  The history is provided by the patient.  He had onset about 9 AM of a left temporal headache described as like something exploded in his head. He does have history of migraines and thought it was a migraine. This was followed by vomiting, which is typical of his migraines. However, following that, his daughter found him confused and called his wife. She also noted that he was markedly confused. She states that he was. He was not talking sense. They called his neurologist who recommended he come to the ED. When he arrived in the ED, he still had a mild headache, but that has now completely resolved. His wife states that he is back to his baseline mentation. There is no bowel or bladder incontinence. There was no bit lip or tongue. He has a history of complex partial seizures, but has had generalized seizures as well. The confusion that he exhibited was similar to what he had following prior generalized seizures. He claims compliance with his anticonvulsant therapy.  Past Medical History:  Diagnosis Date  . Depression   . Migraine   . Seizures [facility])     Patient Active Problem List   Diagnosis Date Noted  . Generalized headaches 07/09/2015  . History of migraine 07/09/2015  . Somnolence, daytime 07/10/2014  . Encounter for therapeutic drug monitoring 01/07/2013  . Seizure disorder, complex partial (HCC) 07/06/2012    Past Surgical History:  Procedure Laterality Date  . REFRACTIVE SURGERY         Home Medications    Prior to Admission medications   Medication Sig Start Date End Date Taking? Authorizing Provider  lamoTRIgine (LAMICTAL) 100 MG tablet TAKE 1.5 TABLETS (150 MG TOTAL) BY MOUTH 2 (TWO) TIMES DAILY. 09/06/16   Nilda Riggs, NP    Family  History Family History  Problem Relation Age of Onset  . Cancer Maternal Aunt   . Cancer Maternal Uncle   . Cancer Maternal Grandmother     Social History Social History  Substance Use Topics  . Smoking status: Former Smoker    Types: Cigarettes  . Smokeless tobacco: Current User    Types: Chew  . Alcohol use 0.0 oz/week     Comment: every once in a while      Allergies   Amoxil [amoxicillin]; Shellfish allergy; and Penicillins   Review of Systems Review of Systems  All other systems reviewed and are negative.    Physical Exam Updated Vital Signs BP 108/68 (BP Location: Left Arm)   Pulse (!) 59   Temp 98.1 F (36.7 C) (Oral)   Resp 18   SpO2 96%   Physical Exam  Nursing note and vitals reviewed.  36 year old male, resting comfortably and in no acute distress. Vital signs are normal. Oxygen saturation is 96%, which is normal. Head is normocephalic and atraumatic. PERRLA, EOMI. Oropharynx is clear. Neck is nontender and supple without adenopathy or JVD. Back is nontender and there is no CVA tenderness. Lungs are clear without rales, wheezes, or rhonchi. Chest is nontender. Heart has regular rate and rhythm without murmur. Abdomen is soft, flat, nontender without masses or hepatosplenomegaly and peristalsis is normoactive. Extremities have no cyanosis or edema, full range of motion is present. Skin is  warm and dry without rash. Neurologic: Mental status is normal, cranial nerves are intact, there are no motor or sensory deficits.  ED Treatments / Results   Radiology Ct Head Wo Contrast  Result Date: 09/19/2016 CLINICAL DATA:  36 y/o M; history of epilepsy. Presenting with seizure and severe left frontotemporal headache. EXAM: CT HEAD WITHOUT CONTRAST TECHNIQUE: Contiguous axial images were obtained from the base of the skull through the vertex without intravenous contrast. COMPARISON:  06/04/2011 CT of the head FINDINGS: Brain: No evidence of acute infarction,  hemorrhage, hydrocephalus, extra-axial collection or mass lesion/mass effect. Stable small prominent left paramedian retrocerebellar extra-axial space which may represent arachnoid cyst or mega cisterna magna. Vascular: No hyperdense vessel or unexpected calcification. Skull: Normal. Negative for fracture or focal lesion. Sinuses/Orbits: No acute finding. Other: None. IMPRESSION: No acute intracranial abnormality. Stable unremarkable CT of the head. Electronically Signed   By: Mitzi HansenLance  Furusawa-Stratton M.D.   On: 09/19/2016 14:32    Procedures Procedures (including critical care time)  Medications Ordered in ED Medications  oxyCODONE-acetaminophen (PERCOCET/ROXICET) 5-325 MG per tablet 1 tablet (1 tablet Oral Given 09/19/16 1210)  oxyCODONE-acetaminophen (PERCOCET/ROXICET) 5-325 MG per tablet (not administered)     Initial Impression / Assessment and Plan / ED Course  I have reviewed the triage vital signs and the nursing notes.  Pertinent labs & imaging results that were available during my care of the patient were reviewed by me and considered in my medical decision making (see chart for details).  Headache followed by period of confusion. Old records are reviewed, and he does have prior ED visits for seizures. At this point, with his complete return to normal, I feel this most likely represents a variation on his seizures. No red flags to suggest more serious pathology. He has an appointment with his neurologist for 1 week from today. He is to call his neurologist to see if that appointment can be moved up. In the meantime, no change in his seizure medications.  Final Clinical Impressions(s) / ED Diagnoses   Final diagnoses:  Bad headache  Seizure disorder [facility](HCC)    New Prescriptions New Prescriptions   No medications on file     Dione BoozeGlick, Isack Lavalley, MD 09/19/16 [phone]

## 2016-09-19 NOTE — Telephone Encounter (Signed)
Called patient's wife; patient answered the phone. He stated [facility] were [facility] This RN stated to go on into ED and have wife call later with update. Patient verbalized understanding, agreement.

## 2016-09-19 NOTE — Telephone Encounter (Signed)
Patient wife just called and she states her husband is having a panic attack and she doesn't know if she should bring him here or take him to the emergency room. Best number to contact his wife is [phone]

## 2016-09-20 NOTE — Telephone Encounter (Signed)
Please let pt know we will try to work him in next week.

## 2016-09-20 NOTE — Telephone Encounter (Signed)
See previous note

## 2016-09-21 ENCOUNTER — Ambulatory Visit: Payer: BLUE CROSS/BLUE SHIELD | Admitting: Nurse Practitioner

## 2016-09-21 ENCOUNTER — Telehealth: Payer: Self-pay | Admitting: Nurse Practitioner

## 2016-09-21 NOTE — Telephone Encounter (Signed)
LVM advising patient that if he is doing well he may wait until his scheduled follow up 10/03/16. Requested he call back to discuss.

## 2016-09-21 NOTE — Telephone Encounter (Signed)
Patient called office stating he is having a headache but not to bad.  Patient states he is trying to be seen next week on Monday or Friday, but we have no openings.  Are we able to work him in?  Please call

## 2016-09-21 NOTE — Telephone Encounter (Signed)
LVM informing patient that Enid Skeens MArtin NP has opening tomorrow morning at 9:15 and 9:45. Advised him this RN is putting the 9:45 am FU on hold for him. Requested he call back as ap in the morning to let office know if he can come in. Left office number.

## 2016-09-21 NOTE — Telephone Encounter (Signed)
Patient called office returning RN's call.  Please call °

## 2016-09-21 NOTE — Telephone Encounter (Signed)
See other phone note

## 2016-09-21 NOTE — Telephone Encounter (Signed)
LVM advising if he is doing well he can tell phone staff he wants to keep FU on 10/03/16. Requested he call back and advise.

## 2016-09-21 NOTE — Telephone Encounter (Signed)
LVM requesting cal back.

## 2016-09-23 NOTE — Telephone Encounter (Signed)
LVM advising patient that if he is doing well we will see him 10/03/16 for his scheduled FU. Advised if he has questions to call by noon today as office closes at noon. Advised him if he has any problems over the weekend to go to the ED. Also informed him this office has a dr on call on weekends.  Left office number.

## 2016-10-03 ENCOUNTER — Encounter: Payer: Self-pay | Admitting: Nurse Practitioner

## 2016-10-03 ENCOUNTER — Ambulatory Visit (INDEPENDENT_AMBULATORY_CARE_PROVIDER_SITE_OTHER): Payer: BLUE CROSS/BLUE SHIELD | Admitting: Nurse Practitioner

## 2016-10-03 VITALS — BP 113/76 | HR 95 | Wt 196.6 lb

## 2016-10-03 DIAGNOSIS — G40209 Localization-related (focal) (partial) symptomatic epilepsy and epileptic syndromes with complex partial seizures, not intractable, without status epilepticus: Secondary | ICD-10-CM | POA: Diagnosis not present

## 2016-10-03 DIAGNOSIS — Z8669 Personal history of other diseases of the nervous system and sense organs: Secondary | ICD-10-CM

## 2016-10-03 NOTE — Progress Notes (Signed)
I have reviewed and agreed above plan. 

## 2016-10-03 NOTE — Progress Notes (Signed)
[facility]  PATIENT: Tony LoserChristopher Valentine DOB: 11/26/1980   REASON FOR VISIT: Follow-up for complex partial seizure disorder,  HISTORY FROM: Patient   HISTORY OF PRESENT ILLNESS:UPDATE 07/23/2018CM Tony Valentine, 36 year old male returns for follow-up. He has a history of seizure disorder. He was seen in the emergency room 07/09/018 for left temporal headache. He does have a history of migraines this was followed by some vomiting which is also typical of his headaches, but then he became confused and the wife brought into the hospital. On arrival he still had a mild headache but it resolved and he was back to baseline it was no bowel or bladder incontinence with this episode he had not bitten his lip or time. He denies missing any doses of his Lamictal. He tells me today that he was going through a strssful situation in his personal life.09/19/16 CT of the head was normal without acute abnormality. He has not had further episodes, he has not had further headaches. He returns for reevaluation  UPDATE 01/10/2018CM Tony Valentine, 36 year old male returns for follow-up for his seizure disorder. He reports waking up with your one day recently. He had no overt seizure activity. He remains on Lamictal 150 twice daily. He denies missing any doses of his medication. Headaches are in good control. His sleep evaluation was negative for obstructive sleep apnea. He does have some periodic leg movements but they are not troubling to him. He returns for reevaluation   UPDATE 07/10/2017CM Tony Valentine, 36 year old male returns for follow-up with his wife. He has a history of seizure disorder and migraine headaches last partial seizure occurred in April which lasted about 30 seconds. Last Lamictal level  was 7.3 Headaches are in good control. He did have a migraine last week due to lack of sleep. Wife reports today that he snores and quits breathing. He has been asked to have a sleep study in the past  however he did not follow through. He complains of daytime drowsiness. He awakens frequently during the night. He returns for reevaluation   07/09/2015 CMMr. Tony Valentine, 36 year old male returns for yearly follow-up, with history of complex partial seizure disorder. Last seizure occurred several weeks ago and lasted about 30 seconds his seizures are episodes of dizziness. He also has a new complaint today of headache and states he has a history of migraines as a child. He is having approximately 1 headache per week starting on the left frontal area and moving back to the neck. He says lack of sleep is a reason for his headaches. He was asked to get a sleep evaluation at his last visit however he did not do that. He has been taking Advil for migraine. He has not kept a record. He occasionally has photophobia and phonophobia but not always. His headaches may happen on awakening or mid day going to bed. He returns for reevaluation    4/28/16CMMr. Tony Valentine 36 year old male returns for followup. He was last seen in the office 07/11/2013 . He has a complex partial seizure disorder, last seizure occurred in November 2013. He is currently on Lamictal 150 mg twice daily. He denies any staring spells, confusion, sleep disturbances lapses of time, etc. He has a new complaint today of daytime drowsiness fatigue. He does snore at night He returns for reevaluation   HISTORY: of complex partial seizure disorder and is currently on lamotrigine 150 twice a day Date of last seizure November 2013 however he does report occasional dizziness. In further questioning he  does not take his medication every 12 hours sometimes he may be 3 hours late. Discussed the importance of taking as directed. Denies staring spells, confusion, sleep disturbances, lapses of time, headache and bowel and bladder incontinence. 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 His EEG was normal. He is tolerating his lamotrigine without side effects.   REVIEW OF SYSTEMS: Full 14 system review of systems performed and notable only for those listed, all others are neg:  Constitutional: neg  Cardiovascular: neg Ear/Nose/Throat: neg  Skin: neg Eyes: neg Respiratory: neg Gastroitestinal: neg  Hematology/Lymphatic: neg  Endocrine: neg Musculoskeletal: Joint pain Allergy/Immunology: Food allergies Neurological: Headache seizure Psychiatric Sleep  snoring negative for obstructive sleep apnea for sleep study   ALLERGIES: Allergies  Allergen Reactions  . Amoxil [Amoxicillin] Hives and Shortness Of Breath  . Penicillins Hives and Shortness Of Breath    Has patient had a PCN reaction causing immediate rash, facial/tongue/throat swelling, SOB or lightheadedness with hypotension: Yes Has patient had a PCN reaction causing severe rash involving mucus membranes or skin necrosis: No Has patient had a PCN reaction that required hospitalization: No Has patient had a PCN reaction occurring within the last 10 years: No If all of the above answers are "NO", then may proceed with Cephalosporin use.   Marland Kitchen Shellfish Allergy Anaphylaxis    HOME MEDICATIONS: Outpatient Medications Prior to Visit  Medication Sig Dispense Refill  . acetaminophen (TYLENOL) 500 MG tablet Take 500-1,000 mg by mouth every 6 (six) hours as needed (for headache).    . lamoTRIgine (LAMICTAL) 100 MG tablet TAKE 1.5 TABLETS (150 MG TOTAL) BY MOUTH 2 (TWO) TIMES DAILY. 270 tablet 3   No facility-administered medications prior to visit.     PAST MEDICAL HISTORY: Past Medical History:  Diagnosis Date  . Depression   . Migraine   . Seizures (HCC)    possible sz 09/19/16    PAST SURGICAL HISTORY: Past Surgical History:  Procedure Laterality Date  . REFRACTIVE SURGERY      FAMILY HISTORY: Family History  Problem Relation Age of Onset  . Cancer Maternal Aunt   . Cancer  Maternal Uncle   . Cancer Maternal Grandmother     SOCIAL HISTORY: Social History   Social History  . Marital status: Married    Spouse name: Tony Valentine  . Number of children: 2  . Years of education: 12   Occupational History  .      [employer]   Social History Main Topics  . Smoking status: Former Smoker    Types: Cigarettes  . Smokeless tobacco: Current User    Types: Chew  . Alcohol use 0.0 oz/week     Comment: every once in a while   . Drug use: No  . Sexual activity: Not on file   Other Topics Concern  . Not on file   Social History Narrative   Patient lives at home with his wife Tony Valentine) Patient works full time.   Right handed.   Education. Patient is in college now.   Caffeine- two to four cups daily.   Patient has 2 children.           PHYSICAL EXAM  Vitals:   10/03/16 1434  BP: 113/76  Pulse: 95  Weight: 196 lb 9.6 oz (89.2 kg)   Body mass index is 32.72 kg/m. Generalized: Well developed, obese male in no acute distress  Neck supple   Neurological examination   Mentation: Alert oriented to time, place, history taking. Follows all commands speech and language fluent.  Cranial nerve II-XII: Pupils were equal round reactive to light extraocular movements were full, visual field were full on confrontational test. Facial sensation and strength were normal. hearing was intact to finger rubbing bilaterally. Uvula tongue midline. head turning and shoulder shrug were normal and symmetric.Tongue protrusion into cheek strength was normal. Motor: normal bulk and tone, full strength in the BUE, BLE, fine finger movements normal, no pronator drift. No focal weakness Coordination: finger-nose-finger, heel-to-shin bilaterally, no dysmetria Reflexes: Brachioradialis 2/2, biceps 2/2, triceps 2/2, patellar 2/2, Achilles 2/2, plantar responses were flexor bilaterally. Gait and Station: Rising up from seated position without assistance, normal stance, moderate  stride, good arm swing, smooth turning, able to perform tiptoe, and heel walking without difficulty. Tandem gait is steady  DIAGNOSTIC DATA (LABS, IMAGING, TESTING) - I reviewed patient records, labs, notes, testing and imaging myself where available.  Lab Results  Component Value Date   WBC 4.9 07/09/2015   HGB 14.3 07/09/2015   HCT 43.6 07/09/2015   MCV 89 07/09/2015   PLT 279 07/09/2015      Component Value Date/Time   NA 143 07/09/2015 0836   K 5.4 (H) 07/09/2015 0836   CL 105 07/09/2015 0836   CO2 23 07/09/2015 0836   GLUCOSE 94 07/09/2015 0836   GLUCOSE 95 06/05/2011 0014   BUN 15 07/09/2015 0836   CREATININE 1.11 07/09/2015 0836   CALCIUM 9.6 07/09/2015 0836   PROT 6.6 07/09/2015 0836   ALBUMIN 4.9 07/09/2015 0836   AST 13 07/09/2015 0836   ALT 14 07/09/2015 0836   ALKPHOS 52 07/09/2015 0836   BILITOT 0.4 07/09/2015 0836   GFRNONAA 87 07/09/2015 0836   GFRAA 100 07/09/2015 0836    ASSESSMENT AND PLAN 36 y.o. year old male  has a past medical history of Seizures (HCC) and Migraine and  daytime drowsiness here to follow-up.His sleep study was negative for obstructive sleep apnea although he does have periodic leg movements which are not bothersome to him. He was seen in the emergency room 07/09/018 for left temporal headache. He does have a history of migraines this was followed by some vomiting which is also typical of his headaches, but then he became confused and the wife brought into the hospital. On arrival he still had a mild headache but it resolved and he was back to baseline it was no bowel or bladder incontinence with this episode he had not bitten his lip or time. He denies missing any doses of his Lamictal. He tells me today that he was going through a strssful situation in his personal life.09/19/16 CT of the head was normal without acute abnormality  PLAN: Continue Lamictal at current dose for now   Call for seizure activity Call for increase in  headaches Given information on good sleep hygiene Follow-up in 6 months Nilda RiggsNancy Carolyn Linas Stepter, [place] CanyonGNP, [facility] CenterBC, APRN  [facility] [address], [address] [place], [place] [id number] [phone]

## 2016-10-03 NOTE — Patient Instructions (Signed)
Continue Lamictal at current dose for now   Call for seizure activity Follow-up in 6 months

## 2016-12-28 DIAGNOSIS — J301 Allergic rhinitis due to pollen: Secondary | ICD-10-CM | POA: Diagnosis not present

## 2016-12-28 DIAGNOSIS — R400 Somnolence: Secondary | ICD-10-CM | POA: Diagnosis not present

## 2016-12-28 DIAGNOSIS — R0683 Snoring: Secondary | ICD-10-CM | POA: Diagnosis not present

## 2017-03-09 ENCOUNTER — Other Ambulatory Visit: Payer: Self-pay | Admitting: *Deleted

## 2017-03-09 MED ORDER — LAMOTRIGINE 100 MG PO TABS: ORAL_TABLET | ORAL | 1 refills | Status: DC

## 2017-03-09 NOTE — Telephone Encounter (Signed)
Received [facility] new prescription request.  Prescription for lamictal 100mg  tabs (take 1.5 tablets po BID) # 270 and one refill.  Has appt 04-10-2017 with CM/NP.

## 2017-04-07 NOTE — Progress Notes (Deleted)
[facility]  PATIENT: Tony LoserChristopher Valentine DOB: 11/26/1980   REASON FOR VISIT: Follow-up for complex partial seizure disorder,  HISTORY FROM: Patient   HISTORY OF PRESENT ILLNESS:UPDATE 07/23/2018CM Tony Valentine, 37 year old male returns for follow-up. He has a history of seizure disorder. He was seen in the emergency room 07/09/018 for left temporal headache. He does have a history of migraines this was followed by some vomiting which is also typical of his headaches, but then he became confused and the wife brought into the hospital. On arrival he still had a mild headache but it resolved and he was back to baseline it was no bowel or bladder incontinence with this episode he had not bitten his lip or time. He denies missing any doses of his Lamictal. He tells me today that he was going through a strssful situation in his personal life.09/19/16 CT of the head was normal without acute abnormality. He has not had further episodes, he has not had further headaches. He returns for reevaluation  UPDATE 01/10/2018CM Tony Valentine, 37 year old male returns for follow-up for his seizure disorder. He reports waking up with your one day recently. He had no overt seizure activity. He remains on Lamictal 150 twice daily. He denies missing any doses of his medication. Headaches are in good control. His sleep evaluation was negative for obstructive sleep apnea. He does have some periodic leg movements but they are not troubling to him. He returns for reevaluation   UPDATE 07/10/2017CM Tony Valentine, 37 year old male returns for follow-up with his wife. He has a history of seizure disorder and migraine headaches last partial seizure occurred in April which lasted about 30 seconds. Last Lamictal level  was 7.3 Headaches are in good control. He did have a migraine last week due to lack of sleep. Wife reports today that he snores and quits breathing. He has been asked to have a sleep study in the past  however he did not follow through. He complains of daytime drowsiness. He awakens frequently during the night. He returns for reevaluation   07/09/2015 CMMr. [name], 37 year old male returns for yearly follow-up, with history of complex partial seizure disorder. Last seizure occurred several weeks ago and lasted about 30 seconds his seizures are episodes of dizziness. He also has a new complaint today of headache and states he has a history of migraines as a child. He is having approximately 1 headache per week starting on the left frontal area and moving back to the neck. He says lack of sleep is a reason for his headaches. He was asked to get a sleep evaluation at his last visit however he did not do that. He has been taking Advil for migraine. He has not kept a record. He occasionally has photophobia and phonophobia but not always. His headaches may happen on awakening or mid day going to bed. He returns for reevaluation    4/28/16CMMr. [name] 37 year old male returns for followup. He was last seen in the office 07/11/2013 . He has a complex partial seizure disorder, last seizure occurred in November 2013. He is currently on Lamictal 150 mg twice daily. He denies any staring spells, confusion, sleep disturbances lapses of time, etc. He has a new complaint today of daytime drowsiness fatigue. He does snore at night He returns for reevaluation   HISTORY: of complex partial seizure disorder and is currently on lamotrigine 150 twice a day Date of last seizure November 2013 however he does report occasional dizziness. In further questioning he  does not take his medication every 12 hours sometimes he may be 3 hours late. Discussed the importance of taking as directed. Denies staring spells, confusion, sleep disturbances, lapses of time, headache and bowel and bladder incontinence. 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 His EEG was normal. He is tolerating his lamotrigine without side effects.   REVIEW OF SYSTEMS: Full 14 system review of systems performed and notable only for those listed, all others are neg:  Constitutional: neg  Cardiovascular: neg Ear/Nose/Throat: neg  Skin: neg Eyes: neg Respiratory: neg Gastroitestinal: neg  Hematology/Lymphatic: neg  Endocrine: neg Musculoskeletal: Joint pain Allergy/Immunology: Food allergies Neurological: Headache seizure Psychiatric Sleep  snoring negative for obstructive sleep apnea for sleep study   ALLERGIES: Allergies  Allergen Reactions  . Amoxil [Amoxicillin] Hives and Shortness Of Breath  . Penicillins Hives and Shortness Of Breath    Has patient had a PCN reaction causing immediate rash, facial/tongue/throat swelling, SOB or lightheadedness with hypotension: Yes Has patient had a PCN reaction causing severe rash involving mucus membranes or skin necrosis: No Has patient had a PCN reaction that required hospitalization: No Has patient had a PCN reaction occurring within the last 10 years: No If all of the above answers are "NO", then may proceed with Cephalosporin use.   Marland Kitchen Shellfish Allergy Anaphylaxis    HOME MEDICATIONS: Outpatient Medications Prior to Visit  Medication Sig Dispense Refill  . acetaminophen (TYLENOL) 500 MG tablet Take 500-1,000 mg by mouth every 6 (six) hours as needed (for headache).    . lamoTRIgine (LAMICTAL) 100 MG tablet TAKE 1.5 TABLETS (150 MG TOTAL) BY MOUTH 2 (TWO) TIMES DAILY. 270 tablet 1   No facility-administered medications prior to visit.     PAST MEDICAL HISTORY: Past Medical History:  Diagnosis Date  . Depression   . Migraine   . Seizures (HCC)    possible sz 09/19/16    PAST SURGICAL HISTORY: Past Surgical History:  Procedure Laterality Date  . REFRACTIVE SURGERY      FAMILY HISTORY: Family History  Problem Relation Age of Onset  . Cancer Maternal Aunt   . Cancer  Maternal Uncle   . Cancer Maternal Grandmother     SOCIAL HISTORY: Social History   Socioeconomic History  . Marital status: Married    Spouse name: Florentina Addison  . Number of children: 2  . Years of education: 38  . Highest education level: Not on file  Social Needs  . Financial resource strain: Not on file  . Food insecurity - worry: Not on file  . Food insecurity - inability: Not on file  . Transportation needs - medical: Not on file  . Transportation needs - non-medical: Not on file  Occupational History    Comment: [employer]  Tobacco Use  . Smoking status: Former Smoker    Types: Cigarettes  . Smokeless tobacco: Current User    Types: Chew  Substance and Sexual Activity  . Alcohol use: Yes    Alcohol/week: 0.0 oz    Comment: every once in a while   . Drug use: No  . Sexual activity: Not on file  Other Topics Concern  . Not on file  Social History Narrative   Patient lives at home with his wife Florentina Addison) Patient works full time.   Right handed.   Education. Patient is in college now.   Caffeine- two to four cups daily.  Patient has 2 children.        PHYSICAL EXAM  There were no vitals filed for this visit. There is no height or weight on file to calculate BMI. Generalized: Well developed, obese male in no acute distress  Neck supple   Neurological examination   Mentation: Alert oriented to time, place, history taking. Follows all commands speech and language fluent.  Cranial nerve II-XII: Pupils were equal round reactive to light extraocular movements were full, visual field were full on confrontational test. Facial sensation and strength were normal. hearing was intact to finger rubbing bilaterally. Uvula tongue midline. head turning and shoulder shrug were normal and symmetric.Tongue protrusion into cheek strength was normal. Motor: normal bulk and tone, full strength in the BUE, BLE, fine finger movements normal, no pronator drift. No focal  weakness Coordination: finger-nose-finger, heel-to-shin bilaterally, no dysmetria Reflexes: Brachioradialis 2/2, biceps 2/2, triceps 2/2, patellar 2/2, Achilles 2/2, plantar responses were flexor bilaterally. Gait and Station: Rising up from seated position without assistance, normal stance, moderate stride, good arm swing, smooth turning, able to perform tiptoe, and heel walking without difficulty. Tandem gait is steady  DIAGNOSTIC DATA (LABS, IMAGING, TESTING) - I reviewed patient records, labs, notes, testing and imaging myself where available.  Lab Results  Component Value Date   WBC 4.9 07/09/2015   HGB 14.3 07/09/2015   HCT 43.6 07/09/2015   MCV 89 07/09/2015   PLT 279 07/09/2015      Component Value Date/Time   NA 143 07/09/2015 0836   K 5.4 (H) 07/09/2015 0836   CL 105 07/09/2015 0836   CO2 23 07/09/2015 0836   GLUCOSE 94 07/09/2015 0836   GLUCOSE 95 06/05/2011 0014   BUN 15 07/09/2015 0836   CREATININE 1.11 07/09/2015 0836   CALCIUM 9.6 07/09/2015 0836   PROT 6.6 07/09/2015 0836   ALBUMIN 4.9 07/09/2015 0836   AST 13 07/09/2015 0836   ALT 14 07/09/2015 0836   ALKPHOS 52 07/09/2015 0836   BILITOT 0.4 07/09/2015 0836   GFRNONAA 87 07/09/2015 0836   GFRAA 100 07/09/2015 0836    ASSESSMENT AND PLAN 37 y.o. year old male  has a past medical history of Seizures (HCC) and Migraine and  daytime drowsiness here to follow-up.His sleep study was negative for obstructive sleep apnea although he does have periodic leg movements which are not bothersome to him. He was seen in the emergency room 07/09/018 for left temporal headache. He does have a history of migraines this was followed by some vomiting which is also typical of his headaches, but then he became confused and the wife brought into the hospital. On arrival he still had a mild headache but it resolved and he was back to baseline it was no bowel or bladder incontinence with this episode he had not bitten his lip or time.  He denies missing any doses of his Lamictal. He tells me today that he was going through a strssful situation in his personal life.09/19/16 CT of the head was normal without acute abnormality  PLAN: Continue Lamictal at current dose for now   Call for seizure activity Call for increase in headaches Given information on good sleep hygiene Follow-up in 6 months Nilda Riggs, [facility], [facility], APRN  [facility] [address], [address] [place], [place] [address] [phone]

## 2017-04-10 ENCOUNTER — Ambulatory Visit: Payer: BLUE CROSS/BLUE SHIELD | Admitting: Nurse Practitioner

## 2017-04-10 ENCOUNTER — Telehealth: Payer: Self-pay | Admitting: *Deleted

## 2017-04-10 NOTE — Telephone Encounter (Signed)
Patient was no show for FU with NP today. 

## 2017-04-11 ENCOUNTER — Encounter: Payer: Self-pay | Admitting: Nurse Practitioner

## 2017-04-13 ENCOUNTER — Telehealth: Payer: Self-pay | Admitting: Nurse Practitioner

## 2017-04-13 NOTE — Telephone Encounter (Signed)
-----   Message from [place], Generic sent at 04/13/2017 2:42 PM EST -----    Appointment Request From: Tony Valentine    With Provider: Nilda RiggsMARTIN,NANCY CAROLYN, NP [[facility]]    Preferred Date Range: 04/19/2017 - 05/03/2017    Preferred Times: Any time    Reason for visit: Request an Appointment    Comments:  Wednesday if at all possible

## 2017-04-17 NOTE — Telephone Encounter (Signed)
Spoke with patient and offered 6 month FU on 04/19/17, arrive 1:15 pm, 1:45 pm follow up with NP.  He accepted FU and requested this RN call him early Wed morning to remind him. This RN advised she will call him. He verbalized understanding, appreciation.

## 2017-04-18 NOTE — Progress Notes (Signed)
[facility]  PATIENT: Tony Valentine DOB: 1980/07/14   REASON FOR VISIT: Follow-up for complex partial seizure disorder, headaches HISTORY FROM: Patient   HISTORY OF PRESENT ILLNESS:UPDATE 2/6/2019CM Tony. Tony Valentine, 37 year old male returns for follow-up history of complex partial seizure disorder.  Last seizure was April 2017.  He remains on Lamictal 150 twice daily without side effects.  He was seen in the emergency room in July of last year for a left temporal headache.  He does have a history of migraines.  He is not particularly bothered by them and he will take Tylenol with relief.  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  He has not had any difficulty with driving.  He returns for reevaluation   UPDATE 07/23/2018CM Tony Valentine, 37 year old male returns for follow-up. He has a history of seizure disorder. He was seen in the emergency room 07/09/018 for left temporal headache. He does have a history of migraines this was followed by some vomiting which is also typical of his headaches, but then he became confused and the wife brought into the hospital. On arrival he still had a mild headache but it resolved and he was back to baseline it was no bowel or bladder incontinence with this episode he had not bitten his lip or time. He denies missing any doses of his Lamictal. He tells me today that he was going through a strssful situation in his personal life.09/19/16 CT of the head was normal without acute abnormality. He has not had further episodes, he has not had further headaches. He returns for reevaluation  UPDATE 01/10/2018CM Tony. Tony Valentine, 37 year old male returns for follow-up for his seizure disorder. He reports waking up with your one day recently. He had no overt seizure activity. He remains on Lamictal 150 twice daily. He denies missing any doses of his medication. Headaches are in good control. His sleep evaluation was negative for obstructive sleep apnea. He does  have some periodic leg movements but they are not troubling to him. He returns for reevaluation   UPDATE 07/10/2017CM Tony. Tony Valentine, 37 year old male returns for follow-up with his wife. He has a history of seizure disorder and migraine headaches last partial seizure occurred in April which lasted about 30 seconds. Last Lamictal level  was 7.3 Headaches are in good control. He did have a migraine last week due to lack of sleep. Wife reports today that he snores and quits breathing. He has been asked to have a sleep study in the past however he did not follow through. He complains of daytime drowsiness. He awakens frequently during the night. He returns for reevaluation   07/09/2015 CMMr. Tony Valentine, 37 year old male returns for yearly follow-up, with history of complex partial seizure disorder. Last seizure occurred several weeks ago and lasted about 30 seconds his seizures are episodes of dizziness. He also has a new complaint today of headache and states he has a history of migraines as a child. He is having approximately 1 headache per week starting on the left frontal area and moving back to the neck. He says lack of sleep is a reason for his headaches. He was asked to get a sleep evaluation at his last visit however he did not do that. He has been taking Advil for migraine. He has not kept a record. He occasionally has photophobia and phonophobia but not always. His headaches may happen on awakening or mid day going to bed. He returns for reevaluation    4/28/16CMMr. Tony Valentine 37 year old male  returns for followup. He was last seen in the office 07/11/2013 . He has a complex partial seizure disorder, last seizure occurred in November 2013. He is currently on Lamictal 150 mg twice daily. He denies any staring spells, confusion, sleep disturbances lapses of time, etc. He has a new complaint today of daytime drowsiness fatigue. He does snore at night He returns for reevaluation   HISTORY: of complex  partial seizure disorder and is currently on lamotrigine 150 twice a day Date of last seizure November 2013 however he does report occasional dizziness. In further questioning he does not take his medication every 12 hours sometimes he may be 3 hours late. Discussed the importance of taking as directed. Denies staring spells, confusion, sleep disturbances, lapses of time, headache and bowel and bladder incontinence. 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 His EEG was normal. He is tolerating his lamotrigine without side effects.   REVIEW OF SYSTEMS: Full 14 system review of systems performed and notable only for those listed, all others are neg:  Constitutional: neg  Cardiovascular: neg Ear/Nose/Throat: neg  Skin: neg Eyes: neg Respiratory: neg Gastroitestinal: neg  Hematology/Lymphatic: neg  Endocrine: neg Musculoskeletal: Joint pain Allergy/Immunology: Food allergies Neurological: Headache seizure Psychiatric Sleep  snoring negative for obstructive sleep apnea for sleep study   ALLERGIES: Allergies  Allergen Reactions  . Amoxil [Amoxicillin] Hives and Shortness Of Breath  . Penicillins Hives and Shortness Of Breath    Has patient had a PCN reaction causing immediate rash, facial/tongue/throat swelling, SOB or lightheadedness with hypotension: Yes Has patient had a PCN reaction causing severe rash involving mucus membranes or skin necrosis: No Has patient had a PCN reaction that required hospitalization: No Has patient had a PCN reaction occurring within the last 10 years: No If all of the above answers are "NO", then may proceed with Cephalosporin use.   Marland Kitchen Shellfish Allergy Anaphylaxis    HOME MEDICATIONS: Outpatient Medications Prior to Visit  Medication Sig Dispense Refill  . acetaminophen (TYLENOL) 500 MG tablet Take 500-1,000 mg by mouth every 6 (six) hours as needed  (for headache).    . lamoTRIgine (LAMICTAL) 100 MG tablet TAKE 1.5 TABLETS (150 MG TOTAL) BY MOUTH 2 (TWO) TIMES DAILY. 270 tablet 1   No facility-administered medications prior to visit.     PAST MEDICAL HISTORY: Past Medical History:  Diagnosis Date  . Depression   . Migraine   . Seizures (HCC)    possible sz 09/19/16    PAST SURGICAL HISTORY: Past Surgical History:  Procedure Laterality Date  . REFRACTIVE SURGERY      FAMILY HISTORY: Family History  Problem Relation Age of Onset  . Cancer Maternal Aunt   . Cancer Maternal Uncle   . Cancer Maternal Grandmother     SOCIAL HISTORY: Social History   Socioeconomic History  . Marital status: Married    Spouse name: Florentina Addison  . Number of children: 2  . Years of education: 18  . Highest education level: Not on file  Social Needs  . Financial resource strain: Not on file  . Food insecurity - worry: Not on file  . Food insecurity - inability: Not on file  . Transportation needs - medical: Not on file  . Transportation needs - non-medical: Not on file  Occupational History    Comment: [employer]  Tobacco Use  . Smoking status: Former Smoker  Types: Cigarettes  . Smokeless tobacco: Current User    Types: Chew  Substance and Sexual Activity  . Alcohol use: Yes    Alcohol/week: 0.0 oz    Comment: every once in a while   . Drug use: No  . Sexual activity: Not on file  Other Topics Concern  . Not on file  Social History Narrative   Patient lives at home with his wife Florentina Addison(keren) Patient works full time.   Right handed.   Education. Patient is in college now.   Caffeine- two to four cups daily.   Patient has 2 children.        PHYSICAL EXAM  Vitals:   04/19/17 1348  BP: 124/76  Pulse: 71  Weight: 196 lb 6.4 oz (89.1 kg)   Body mass index is 32.68 kg/m. Generalized: Well developed, obese male in no acute distress  Neck supple   Neurological examination   Mentation: Alert oriented to time,  place, history taking. Follows all commands speech and language fluent.  Cranial nerve II-XII: Pupils were equal round reactive to light extraocular movements were full, visual field were full on confrontational test. Facial sensation and strength were normal. hearing was intact to finger rubbing bilaterally. Uvula tongue midline. head turning and shoulder shrug were normal and symmetric.Tongue protrusion into cheek strength was normal. Motor: normal bulk and tone, full strength in the BUE, BLE, fine finger movements normal, no pronator drift. No focal weakness Coordination: finger-nose-finger, heel-to-shin bilaterally, no dysmetria Reflexes: Brachioradialis 2/2, biceps 2/2, triceps 2/2, patellar 2/2, Achilles 2/2, plantar responses were flexor bilaterally. Gait and Station: Rising up from seated position without assistance, normal stance, moderate stride, good arm swing, smooth turning, able to perform tiptoe, and heel walking without difficulty. Tandem gait is steady  DIAGNOSTIC DATA (LABS, IMAGING, TESTING) - I reviewed patient records, labs, notes, testing and imaging myself where available.  Lab Results  Component Value Date   WBC 4.9 07/09/2015   HGB 14.3 07/09/2015   HCT 43.6 07/09/2015   MCV 89 07/09/2015   PLT 279 07/09/2015      Component Value Date/Time   NA 143 07/09/2015 0836   K 5.4 (H) 07/09/2015 0836   CL 105 07/09/2015 0836   CO2 23 07/09/2015 0836   GLUCOSE 94 07/09/2015 0836   GLUCOSE 95 06/05/2011 0014   BUN 15 07/09/2015 0836   CREATININE 1.11 07/09/2015 0836   CALCIUM 9.6 07/09/2015 0836   PROT 6.6 07/09/2015 0836   ALBUMIN 4.9 07/09/2015 0836   AST 13 07/09/2015 0836   ALT 14 07/09/2015 0836   ALKPHOS 52 07/09/2015 0836   BILITOT 0.4 07/09/2015 0836   GFRNONAA 87 07/09/2015 0836   GFRAA 100 07/09/2015 0836    ASSESSMENT AND PLAN 37 y.o. year old male  has a past medical history of Seizures (HCC) and Migraine and  daytime drowsiness here to follow-up.His  sleep study was negative for obstructive sleep apnea although he does have periodic leg movements which are not bothersome to him. He was seen in the emergency room 07/09/018 for left temporal headache. He does have a history of migraines this was followed by some vomiting which is also typical of his headaches, but then he became confused and the wife brought into the hospital. On arrival he still had a mild headache but it resolved and he was back to baseline. CT of the head was normal without acute abnormality  PLAN: Continue Lamictal at current dose will refill   Call for seizure  activity Call for increase in headaches Stay well hydrated Follow-up in 8 months Nilda Riggs, [facility], [facility], APRN  [facility] [address], [address] [place], [place] [address] [phone]

## 2017-04-19 ENCOUNTER — Ambulatory Visit (INDEPENDENT_AMBULATORY_CARE_PROVIDER_SITE_OTHER): Payer: BLUE CROSS/BLUE SHIELD | Admitting: Nurse Practitioner

## 2017-04-19 ENCOUNTER — Encounter: Payer: Self-pay | Admitting: Nurse Practitioner

## 2017-04-19 VITALS — BP 124/76 | HR 71 | Wt 196.4 lb

## 2017-04-19 DIAGNOSIS — G40209 Localization-related (focal) (partial) symptomatic epilepsy and epileptic syndromes with complex partial seizures, not intractable, without status epilepticus: Secondary | ICD-10-CM

## 2017-04-19 DIAGNOSIS — R51 Headache: Secondary | ICD-10-CM

## 2017-04-19 DIAGNOSIS — R519 Headache, unspecified: Secondary | ICD-10-CM

## 2017-04-19 MED ORDER — LAMOTRIGINE 100 MG PO TABS: ORAL_TABLET | ORAL | 2 refills | Status: DC

## 2017-04-19 NOTE — Patient Instructions (Signed)
Continue Lamictal at current dose for now   Call for seizure activity Call for increase in headaches Stay well hydrated Follow-up in 8 months

## 2017-04-19 NOTE — Telephone Encounter (Signed)
Per patient's request, this RN called him to remind him of his follow up today with Enid Skeens Martin, NP. Advised he arrive at 1:15 pm for 1:45 pm appointment. He verbalized understanding, appreciation of reminder call.

## 2017-04-20 NOTE — Progress Notes (Signed)
I have reviewed and agreed above plan. 

## 2017-10-25 ENCOUNTER — Ambulatory Visit (INDEPENDENT_AMBULATORY_CARE_PROVIDER_SITE_OTHER): Payer: BLUE CROSS/BLUE SHIELD | Admitting: Primary Care

## 2017-10-25 ENCOUNTER — Encounter: Payer: Self-pay | Admitting: Primary Care

## 2017-10-25 VITALS — BP 124/84 | HR 60 | Temp 98.0°F | Ht 67.0 in | Wt 204.5 lb

## 2017-10-25 DIAGNOSIS — G40209 Localization-related (focal) (partial) symptomatic epilepsy and epileptic syndromes with complex partial seizures, not intractable, without status epilepticus: Secondary | ICD-10-CM

## 2017-10-25 DIAGNOSIS — R400 Somnolence: Secondary | ICD-10-CM | POA: Diagnosis not present

## 2017-10-25 DIAGNOSIS — G8929 Other chronic pain: Secondary | ICD-10-CM

## 2017-10-25 DIAGNOSIS — L638 Other alopecia areata: Secondary | ICD-10-CM | POA: Diagnosis not present

## 2017-10-25 DIAGNOSIS — M25569 Pain in unspecified knee: Secondary | ICD-10-CM

## 2017-10-25 DIAGNOSIS — M25562 Pain in left knee: Secondary | ICD-10-CM

## 2017-10-25 DIAGNOSIS — Z0000 Encounter for general adult medical examination without abnormal findings: Secondary | ICD-10-CM | POA: Diagnosis not present

## 2017-10-25 DIAGNOSIS — M25561 Pain in right knee: Secondary | ICD-10-CM | POA: Diagnosis not present

## 2017-10-25 DIAGNOSIS — R21 Rash and other nonspecific skin eruption: Secondary | ICD-10-CM

## 2017-10-25 LAB — COMPREHENSIVE METABOLIC PANEL
ALBUMIN: 4.6 g/dL (ref 3.5–5.2)
ALK PHOS: 46 U/L (ref 39–117)
ALT: 33 U/L (ref 0–53)
AST: 22 U/L (ref 0–37)
BUN: 16 mg/dL (ref 6–23)
CO2: 30 mEq/L (ref 19–32)
CREATININE: 1.25 mg/dL (ref 0.40–1.50)
Calcium: 10.4 mg/dL (ref 8.4–10.5)
Chloride: 104 mEq/L (ref 96–112)
GFR: 69.68 mL/min (ref >60.00)
GLUCOSE: 91 mg/dL (ref 70–99)
POTASSIUM: 4.3 meq/L (ref 3.5–5.1)
SODIUM: 140 meq/L (ref 135–145)
TOTAL PROTEIN: 7.2 g/dL (ref 6.0–8.3)
Total Bilirubin: 0.6 mg/dL (ref 0.2–1.2)

## 2017-10-25 LAB — LIPID PANEL
CHOLESTEROL: 174 mg/dL (ref 0–200)
HDL: 38.9 mg/dL — ABNORMAL LOW (ref >39.00)
LDL Cholesterol: 114 mg/dL — ABNORMAL HIGH (ref 0–99)
NONHDL: 135.18
Total CHOL/HDL Ratio: 4
Triglycerides: 108 mg/dL (ref 0.0–149.0)
VLDL: 21.6 mg/dL (ref 0.0–40.0)

## 2017-10-25 LAB — HEMOGLOBIN A1C: Hgb A1c MFr Bld: 5.2 % (ref 4.6–6.5)

## 2017-10-25 NOTE — Assessment & Plan Note (Signed)
Underwent sleep study years ago, negative. Discussed weight loss.

## 2017-10-25 NOTE — Assessment & Plan Note (Signed)
Suspect psoriasis vs eczema. Chronic. He will be seeing dermatology this afternoon. Failed several creams in the past.

## 2017-10-25 NOTE — Assessment & Plan Note (Signed)
Chronic for years, once saw orthopedics who told him he "had the knees of a woman who's had three children". He will likely need replacements in the future. Overall manages.

## 2017-10-25 NOTE — Assessment & Plan Note (Signed)
Td UTD. Discussed the importance of a healthy diet and regular exercise in order for weight loss, and to reduce the risk of any potential medical problems. Exam with chronic, stable findings. Labs pending. Follow up in 1 year for CPE.

## 2017-10-25 NOTE — Patient Instructions (Addendum)
Start exercising. You should be getting 150 minutes of moderate intensity exercise weekly.  It's important to improve your diet by reducing consumption of fast food, fried food, processed snack foods, sugary drinks. Increase consumption of fresh vegetables and fruits, whole grains, water.  Ensure you are drinking 64 ounces of water daily.  Please have the dermatologist fax over their notes from your visit today.  It was a pleasure to meet you today! Please don't hesitate to call or message me with any questions. Welcome to [facility]!   Preventive Care 18-39 Years, Male Preventive care refers to lifestyle choices and visits with your health care provider that can promote health and wellness. What does preventive care include?  A yearly physical exam. This is also called an annual well check.  Dental exams once or twice a year.  Routine eye exams. Ask your health care provider how often you should have your eyes checked.  Personal lifestyle choices, including: ? Daily care of your teeth and gums. ? Regular physical activity. ? Eating a healthy diet. ? Avoiding tobacco and drug use. ? Limiting alcohol use. ? Practicing safe sex. What happens during an annual well check? The services and screenings done by your health care provider during your annual well check will depend on your age, overall health, lifestyle risk factors, and family history of disease. Counseling Your health care provider may ask you questions about your:  Alcohol use.  Tobacco use.  Drug use.  Emotional well-being.  Home and relationship well-being.  Sexual activity.  Eating habits.  Work and work Statistician.  Screening You may have the following tests or measurements:  Height, weight, and BMI.  Blood pressure.  Lipid and cholesterol levels. These may be checked every 5 years starting at age 71.  Diabetes screening. This is done by checking your blood sugar (glucose) after you have not eaten  for a while (fasting).  Skin check.  Hepatitis C blood test.  Hepatitis B blood test.  Sexually transmitted disease (STD) testing.  Discuss your test results, treatment options, and if necessary, the need for more tests with your health care provider. Vaccines Your health care provider may recommend certain vaccines, such as:  Influenza vaccine. This is recommended every year.  Tetanus, diphtheria, and acellular pertussis (Tdap, Td) vaccine. You may need a Td booster every 10 years.  Varicella vaccine. You may need this if you have not been vaccinated.  HPV vaccine. If you are 76 or younger, you may need three doses over 6 months.  Measles, mumps, and rubella (MMR) vaccine. You may need at least one dose of MMR.You may also need a second dose.  Pneumococcal 13-valent conjugate (PCV13) vaccine. You may need this if you have certain conditions and have not been vaccinated.  Pneumococcal polysaccharide (PPSV23) vaccine. You may need one or two doses if you smoke cigarettes or if you have certain conditions.  Meningococcal vaccine. One dose is recommended if you are age 59-21 years and a first-year college student living in a residence hall, or if you have one of several medical conditions. You may also need additional booster doses.  Hepatitis A vaccine. You may need this if you have certain conditions or if you travel or work in places where you may be exposed to hepatitis A.  Hepatitis B vaccine. You may need this if you have certain conditions or if you travel or work in places where you may be exposed to hepatitis B.  Haemophilus influenzae type b (Hib) vaccine.  You may need this if you have certain risk factors.  Talk to your health care provider about which screenings and vaccines you need and how often you need them. This information is not intended to replace advice given to you by your health care provider. Make sure you discuss any questions you have with your health care  provider. Document Released: 04/26/2001 Document Revised: 11/18/2015 Document Reviewed: 12/30/2014 Elsevier Interactive Patient Education  [employer].

## 2017-10-25 NOTE — Progress Notes (Signed)
Subjective:    Patient ID: Tony Valentine, male    DOB: 09/04/1980, 37 y.o.   MRN: [id number]  HPI  Tony Valentine is a 37 year old male who presents today to establish care and for complete physical.  1) Seizure Disorder: Partial seizures. Diagnosed at age 89. History of syncope around 2012 and was formally diagnosed. Last episode was 1-2 years ago. Currently managed on Lamictal 150 mg BID. He is currently following with Neurology.  Immunizations: -Tetanus: Completed in 2013 -Influenza: Did not complete last season   Diet: He endorses a poor diet. Breakfast: Skips, yogurt Lunch: Fast food Dinner: Restaurants, rice, beans, fried food, take out food Snacks: None Desserts: Once weekly (ice cream, cake, doughnuts) Beverages: Diet soda occasion, coffee, water mostly, 1-2 times weekly  Exercise: He is not exercising Eye exam: Completed years ago  Dental exam: Completes semi-annually   Review of Systems  Constitutional: Negative for unexpected weight change.  HENT: Negative for rhinorrhea.   Respiratory: Negative for cough and shortness of breath.   Cardiovascular: Negative for chest pain.  Gastrointestinal: Negative for constipation and diarrhea.  Genitourinary: Negative for difficulty urinating.  Musculoskeletal: Positive for arthralgias.       Bilateral knee pain, chronic  Skin: Positive for rash.       Chronic rash to right elbow and right hand  Allergic/Immunologic: Negative for environmental allergies.  Neurological: Negative for dizziness and headaches.  Psychiatric/Behavioral: The patient is not nervous/anxious.        Past Medical History:  Diagnosis Date  . Depression   . Migraine   . Seizures (HCC)    possible sz 09/19/16     Social History   Socioeconomic History  . Marital status: Married    Spouse name: [name]  . Number of children: 2  . Years of education: 1412  . Highest education level: Not on file  Occupational History    Comment: [employer]  Social Needs  . Financial resource strain: Not on file  . Food insecurity:    Worry: Not on file    Inability: Not on file  . Transportation needs:    Medical: Not on file    Non-medical: Not on file  Tobacco Use  . Smoking status: Former Smoker    Types: Cigarettes  . Smokeless tobacco: Former NeurosurgeonUser    Types: Chew  Substance and Sexual Activity  . Alcohol use: Yes    Comment: every once in a while   . Drug use: No  . Sexual activity: Not on file  Lifestyle  . Physical activity:    Days per week: Not on file    Minutes per session: Not on file  . Stress: Not on file  Relationships  . Social connections:    Talks on phone: Not on file    Gets together: Not on file    Attends religious service: Not on file    Active member of club or organization: Not on file    Attends meetings of clubs or organizations: Not on file    Relationship status: Not on file  . Intimate partner violence:    Fear of current or ex partner: Not on file    Emotionally abused: Not on file    Physically abused: Not on file    Forced sexual activity: Not on file  Other Topics Concern  . Not on file  Social History Narrative   Patient lives at home with his wife Florentina Addison(keren) Patient works full  time.   Right handed.   Bachelors in Chief Financial OfficerMarketing. Works in [employer].   Caffeine- two to four cups daily.   Patient has 2 children.       Past Surgical History:  Procedure Laterality Date  . REFRACTIVE SURGERY      Family History  Problem Relation Age of Onset  . Arthritis Mother   . Depression Mother   . Alcohol abuse Father   . Hypertension Father   . Hyperlipidemia Father   . Cancer Maternal Aunt   . Cancer Maternal Uncle   . Cancer Maternal Grandmother   . Diabetes Maternal Grandmother   . Alcohol abuse Sister   . Depression Sister     Allergies  Allergen Reactions  . Amoxil [Amoxicillin] Hives and Shortness Of Breath  . Penicillins Hives and Shortness Of Breath    Has patient had a  PCN reaction causing immediate rash, facial/tongue/throat swelling, SOB or lightheadedness with hypotension: Yes Has patient had a PCN reaction causing severe rash involving mucus membranes or skin necrosis: No Has patient had a PCN reaction that required hospitalization: No Has patient had a PCN reaction occurring within the last 10 years: No If all of the above answers are "NO", then may proceed with Cephalosporin use.   . Shellfish Allergy Anaphylaxis    Current Outpatient Medications on File Prior to Visit  Medication Sig Dispense Refill  . lamoTRIgine (LAMICTAL) 100 MG tablet TAKE 1.5 TABLETS (150 MG TOTAL) BY MOUTH 2 (TWO) TIMES DAILY. 270 tablet 2   No current facility-administered medications on file prior to visit.     BP 124/84   Pulse 60   Temp 98 F (36.7 C) (Oral)   Ht 5\' 7"  (1.702 m)   Wt 204 lb 8 oz (92.8 kg)   SpO2 98%   BMI 32.03 kg/m    Objective:   Physical Exam  Constitutional: He is oriented to person, place, and time. He appears well-nourished.  HENT:  Mouth/Throat: No oropharyngeal exudate.  Eyes: Pupils are equal, round, and reactive to light. EOM are normal.  Neck: Neck supple. No thyromegaly present.  Cardiovascular: Normal rate and regular rhythm.  Respiratory: Effort normal and breath sounds normal.  GI: Soft. Bowel sounds are normal. There is no tenderness.  Musculoskeletal: Normal range of motion.  Neurological: He is alert and oriented to person, place, and time.  Skin: Skin is warm and dry.  Scaly, flesh colored rash with mild erythema to right elbow. Scaly rash to right palmer hand.  Psychiatric: He has a normal mood and affect.           Assessment & Plan:

## 2017-10-25 NOTE — Assessment & Plan Note (Signed)
Follows with Neurology, compliant to lamictal.

## 2017-12-18 NOTE — Progress Notes (Signed)
[facility]  PATIENT: Tony Valentine DOB: [birth year]   REASON FOR VISIT: Follow-up for complex partial seizure disorder, headaches HISTORY FROM: Patient   HISTORY OF PRESENT ILLNESS:UPDATE 10/9/2019CM Tony Valentine is a 37 year old male returns for follow-up with history of seizure disorder complex partial with last seizure occurring April 2017.  He is currently on Lamictal 150 twice daily without side effects.  He also has a history of migraine headaches which are in good control at present.  He usually takes Tylenol with relief.  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  He returns for reevaluation    UPDATE 2/6/2019CM Tony Valentine, 37 year old male returns for follow-up history of complex partial seizure disorder.  Last seizure was April 2017.  He remains on Lamictal 150 twice daily without side effects.  He was seen in the emergency room in July of last year for a left temporal headache.  He does have a history of migraines.  He is not particularly bothered by them and he will take Tylenol with relief.  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  He has not had any difficulty with driving.  He returns for reevaluation   UPDATE 07/23/2018CM Tony Valentine, 37 year old male returns for follow-up. He has a history of seizure disorder. He was seen in the emergency room 07/09/018 for left temporal headache. He does have a history of migraines this was followed by some vomiting which is also typical of his headaches, but then he became confused and the wife brought into the hospital. On arrival he still had a mild headache but it resolved and he was back to baseline it was no bowel or bladder incontinence with this episode he had not bitten his lip or time. He denies missing any doses of his Lamictal. He tells me today that he was going through a strssful situation in his personal life.09/19/16 CT of the head was normal without acute abnormality. He has  not had further episodes, he has not had further headaches. He returns for reevaluation  UPDATE 01/10/2018CM Tony Valentine, 37 year old male returns for follow-up for his seizure disorder. He reports waking up with your one day recently. He had no overt seizure activity. He remains on Lamictal 150 twice daily. He denies missing any doses of his medication. Headaches are in good control. His sleep evaluation was negative for obstructive sleep apnea. He does have some periodic leg movements but they are not troubling to him. He returns for reevaluation   UPDATE 07/10/2017CM Tony Valentine, 37 year old male returns for follow-up with his wife. He has a history of seizure disorder and migraine headaches last partial seizure occurred in April which lasted about 30 seconds. Last Lamictal level  was 7.3 Headaches are in good control. He did have a migraine last week due to lack of sleep. Wife reports today that he snores and quits breathing. He has been asked to have a sleep study in the past however he did not follow through. He complains of daytime drowsiness. He awakens frequently during the night. He returns for reevaluation   07/09/2015 CMMr. [name], 37 year old male returns for yearly follow-up, with history of complex partial seizure disorder. Last seizure occurred several weeks ago and lasted about 30 seconds his seizures are episodes of dizziness. He also has a new complaint today of headache and states he has a history of migraines as a child. He is having approximately 1 headache per week starting on the  left frontal area and moving back to the neck. He says lack of sleep is a reason for his headaches. He was asked to get a sleep evaluation at his last visit however he did not do that. He has been taking Advil for migraine. He has not kept a record. He occasionally has photophobia and phonophobia but not always. His headaches may happen on awakening or mid day going to bed. He returns for  reevaluation    4/28/16CMMr. Valentine 37 year old male returns for followup. He was last seen in the office 07/11/2013 . He has a complex partial seizure disorder, last seizure occurred in November 2013. He is currently on Lamictal 150 mg twice daily. He denies any staring spells, confusion, sleep disturbances lapses of time, etc. He has a new complaint today of daytime drowsiness fatigue. He does snore at night He returns for reevaluation   HISTORY: of complex partial seizure disorder and is currently on lamotrigine 150 twice a day Date of last seizure November 2013 however he does report occasional dizziness. In further questioning he does not take his medication every 12 hours sometimes he may be 3 hours late. Discussed the importance of taking as directed. Denies staring spells, confusion, sleep disturbances, lapses of time, headache and bowel and bladder incontinence. 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 His EEG was normal. He is tolerating his lamotrigine without side effects.   REVIEW OF SYSTEMS: Full 14 system review of systems performed and notable only for those listed, all others are neg:  Constitutional: neg  Cardiovascular: neg Ear/Nose/Throat: neg  Skin: neg Eyes: neg Respiratory: neg Gastroitestinal: neg  Hematology/Lymphatic: neg  Endocrine: neg Musculoskeletal: Joint pain Allergy/Immunology: Food allergies Neurological: Headache seizure Psychiatric Sleep  snoring negative for obstructive sleep apnea for sleep study   ALLERGIES: Allergies  Allergen Reactions  . Amoxil [Amoxicillin] Hives and Shortness Of Breath  . Penicillins Hives and Shortness Of Breath    Has patient had a PCN reaction causing immediate rash, facial/tongue/throat swelling, SOB or lightheadedness with hypotension: Yes Has patient had a PCN reaction causing severe rash involving mucus  membranes or skin necrosis: No Has patient had a PCN reaction that required hospitalization: No Has patient had a PCN reaction occurring within the last 10 years: No If all of the above answers are "NO", then may proceed with Cephalosporin use.   . Shellfish Allergy Anaphylaxis    HOME MEDICATIONS: Outpatient Medications Prior to Visit  Medication Sig Dispense Refill  . lamoTRIgine (LAMICTAL) 100 MG tablet TAKE 1.5 TABLETS (150 MG TOTAL) BY MOUTH 2 (TWO) TIMES DAILY. 270 tablet 2   No facility-administered medications prior to visit.     PAST MEDICAL HISTORY: Past Medical History:  Diagnosis Date  . Depression   . Migraine   . Seizures (HCC)    possible sz 09/19/16    PAST SURGICAL HISTORY: Past Surgical History:  Procedure Laterality Date  . REFRACTIVE SURGERY      FAMILY HISTORY: Family History  Problem Relation Age of Onset  . Arthritis Mother   . Depression Mother   . Alcohol abuse Father   . Hypertension Father   . Hyperlipidemia Father   . Cancer Maternal Aunt   . Cancer Maternal Uncle   . Cancer Maternal Grandmother   . Diabetes Maternal Grandmother   . Alcohol abuse Sister   . Depression Sister     SOCIAL HISTORY: Social History  Socioeconomic History  . Marital status: Married    Spouse name: Florentina Addison  . Number of children: 2  . Years of education: 46  . Highest education level: Not on file  Occupational History    Comment: [employer]  Social Needs  . Financial resource strain: Not on file  . Food insecurity:    Worry: Not on file    Inability: Not on file  . Transportation needs:    Medical: Not on file    Non-medical: Not on file  Tobacco Use  . Smoking status: Former Smoker    Types: Cigarettes  . Smokeless tobacco: Former Neurosurgeon    Types: Chew  Substance and Sexual Activity  . Alcohol use: Yes    Comment: every once in a while   . Drug use: No  . Sexual activity: Not on file  Lifestyle  . Physical activity:    Days per  week: Not on file    Minutes per session: Not on file  . Stress: Not on file  Relationships  . Social connections:    Talks on phone: Not on file    Gets together: Not on file    Attends religious service: Not on file    Active member of club or organization: Not on file    Attends meetings of clubs or organizations: Not on file    Relationship status: Not on file  . Intimate partner violence:    Fear of current or ex partner: Not on file    Emotionally abused: Not on file    Physically abused: Not on file    Forced sexual activity: Not on file  Other Topics Concern  . Not on file  Social History Narrative   Patient lives at home with his wife Florentina Addison) Patient works full time.   Right handed.   Bachelors in Chief Financial Officer. Works in Airline pilot.   Caffeine- two to four cups daily.   Patient has 2 children.        PHYSICAL EXAM  Vitals:   12/20/17 1001  BP: 121/82  Pulse: 91  Weight: 202 lb (91.6 kg)  Height: 5\' 7"  (1.702 m)   Body mass index is 31.64 kg/m. Generalized: Well developed, obese male in no acute distress  Neck supple   Neurological examination   Mentation: Alert oriented to time, place, history taking. Follows all commands speech and language fluent.  Cranial nerve II-XII: Pupils were equal round reactive to light extraocular movements were full, visual field were full on confrontational test. Facial sensation and strength were normal. hearing was intact to finger rubbing bilaterally. Uvula tongue midline. head turning and shoulder shrug were normal and symmetric.Tongue protrusion into cheek strength was normal. Motor: normal bulk and tone, full strength in the BUE, BLE, fine finger movements normal, no pronator drift. No focal weakness Coordination: finger-nose-finger, heel-to-shin bilaterally, no dysmetria Reflexes: Brachioradialis 2/2, biceps 2/2, triceps 2/2, patellar 2/2, Achilles 2/2, plantar responses were flexor bilaterally. Gait and Station: Rising up from  seated position without assistance, normal stance, moderate stride, good arm swing, smooth turning, able to perform tiptoe, and heel walking without difficulty. Tandem gait is steady  DIAGNOSTIC DATA (LABS, IMAGING, TESTING) - I reviewed patient records, labs, notes, testing and imaging myself where available.  Lab Results  Component Value Date   WBC 4.9 07/09/2015   HGB 14.3 07/09/2015   HCT 43.6 07/09/2015   MCV 89 07/09/2015   PLT 279 07/09/2015      Component Value Date/Time  NA 140 10/25/2017 1112   NA 143 07/09/2015 0836   K 4.3 10/25/2017 1112   CL 104 10/25/2017 1112   CO2 30 10/25/2017 1112   GLUCOSE 91 10/25/2017 1112   BUN 16 10/25/2017 1112   BUN 15 07/09/2015 0836   CREATININE 1.25 10/25/2017 1112   CALCIUM 10.4 10/25/2017 1112   PROT 7.2 10/25/2017 1112   PROT 6.6 07/09/2015 0836   ALBUMIN 4.6 10/25/2017 1112   ALBUMIN 4.9 07/09/2015 0836   AST 22 10/25/2017 1112   ALT 33 10/25/2017 1112   ALKPHOS 46 10/25/2017 1112   BILITOT 0.6 10/25/2017 1112   BILITOT 0.4 07/09/2015 0836   GFRNONAA 87 07/09/2015 0836   GFRAA 100 07/09/2015 0836    ASSESSMENT AND PLAN 37 y.o. year old male  has a past medical history of Seizures (HCC) and Migraine and  daytime drowsiness here to follow-up.His sleep study was negative for obstructive sleep apnea although he does have periodic leg movements which are not bothersome to him. He was seen in the emergency room 07/09/018 for left temporal headache. He does have a history of migraines this was followed by some vomiting which is also typical of his headaches, but then he became confused and the wife brought into the hospital. On arrival he still had a mild headache but it resolved and he was back to baseline. CT of the head was normal without acute abnormality.  He takes Tylenol if he has a headache  PLAN: Continue Lamictal at current dose will refill   Call for seizure activity Follow-up in 1 year Nilda Riggs, [facility], [facility],  APRN  [facility] [address], [address] [place], [place] [address] [phone]

## 2017-12-20 ENCOUNTER — Ambulatory Visit (INDEPENDENT_AMBULATORY_CARE_PROVIDER_SITE_OTHER): Payer: BLUE CROSS/BLUE SHIELD | Admitting: Nurse Practitioner

## 2017-12-20 ENCOUNTER — Encounter: Payer: Self-pay | Admitting: Nurse Practitioner

## 2017-12-20 VITALS — BP 121/82 | HR 91 | Ht 67.0 in | Wt 202.0 lb

## 2017-12-20 DIAGNOSIS — G40209 Localization-related (focal) (partial) symptomatic epilepsy and epileptic syndromes with complex partial seizures, not intractable, without status epilepticus: Secondary | ICD-10-CM

## 2017-12-20 DIAGNOSIS — Z8669 Personal history of other diseases of the nervous system and sense organs: Secondary | ICD-10-CM

## 2017-12-20 MED ORDER — LAMOTRIGINE 100 MG PO TABS: ORAL_TABLET | ORAL | 3 refills | Status: DC

## 2017-12-20 NOTE — Progress Notes (Signed)
I have reviewed and agreed above plan. 

## 2017-12-20 NOTE — Patient Instructions (Signed)
Continue Lamictal at current dose will refill   Call for seizure activity Follow-up in 1 year

## 2018-12-04 ENCOUNTER — Other Ambulatory Visit: Payer: Self-pay | Admitting: *Deleted

## 2018-12-04 MED ORDER — LAMOTRIGINE 100 MG PO TABS: ORAL_TABLET | ORAL | 3 refills | Status: DC

## 2018-12-26 ENCOUNTER — Other Ambulatory Visit: Payer: Self-pay

## 2018-12-26 ENCOUNTER — Ambulatory Visit: Payer: BC Managed Care – PPO | Admitting: Neurology

## 2018-12-26 ENCOUNTER — Encounter: Payer: Self-pay | Admitting: Neurology

## 2018-12-26 VITALS — BP 130/87 | HR 70 | Temp 98.2°F | Ht 67.0 in | Wt 194.0 lb

## 2018-12-26 DIAGNOSIS — Z8669 Personal history of other diseases of the nervous system and sense organs: Secondary | ICD-10-CM | POA: Diagnosis not present

## 2018-12-26 DIAGNOSIS — G40209 Localization-related (focal) (partial) symptomatic epilepsy and epileptic syndromes with complex partial seizures, not intractable, without status epilepticus: Secondary | ICD-10-CM

## 2018-12-26 MED ORDER — SUMATRIPTAN SUCCINATE 50 MG PO TABS: 50.0000 mg | ORAL_TABLET | ORAL | 3 refills | Status: DC | PRN

## 2018-12-26 MED ORDER — LAMOTRIGINE 100 MG PO TABS: ORAL_TABLET | ORAL | 4 refills | Status: DC

## 2018-12-26 NOTE — Progress Notes (Signed)
[facility]  PATIENT: Tony Valentine DOB: 05-Mar-1981   REASON FOR VISIT: Follow-up for complex partial seizure disorder, headaches HISTORY FROM: Patient   HISTORY OF PRESENT ILLNESS: Tony Valentine is a 38 year old male, with history of of complex partial seizure disorder and is currently on lamotrigine 150 twice a day. Last seizure was in November 2013.  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 His EEG was normal. He is tolerating his lamotrigine without side effects.  He also reported long history of chronic migraine headache, his typical migraine are left retro-orbital area severe pressure, only headache with associated light noise sensitivity, nauseous, lasting for few hours.  He now complains of increased frequency of migraine headaches, couple times each month, he has never tried triptan treatment in the past, he is using over-the-counter medication with partial relief, sleep is always helpful  REVIEW OF SYSTEMS: Full 14 system review of systems performed and notable only for those listed, all others are neg:  As above  ALLERGIES: Allergies  Allergen Reactions  . Amoxil [Amoxicillin] Hives and Shortness Of Breath  . Penicillins Hives and Shortness Of Breath    Has patient had a PCN reaction causing immediate rash, facial/tongue/throat swelling, SOB or lightheadedness with hypotension: Yes Has patient had a PCN reaction causing severe rash involving mucus membranes or skin necrosis: No Has patient had a PCN reaction that required hospitalization: No Has patient had a PCN reaction occurring within the last 10 years: No If all of  the above answers are "NO", then may proceed with Cephalosporin use.   . Shellfish Allergy Anaphylaxis    HOME MEDICATIONS: Outpatient Medications Prior to Visit  Medication Sig Dispense Refill  . lamoTRIgine (LAMICTAL) 100 MG tablet TAKE 1.5 TABLETS (150 MG TOTAL) BY MOUTH 2 (TWO) TIMES DAILY. 270 tablet 3   No facility-administered medications prior to visit.     PAST MEDICAL HISTORY: Past Medical History:  Diagnosis Date  . Depression   . Migraine   . Seizures ([place])    possible sz 09/19/16    PAST SURGICAL HISTORY: Past Surgical History:  Procedure Laterality Date  . REFRACTIVE SURGERY      FAMILY HISTORY: Family History  Problem Relation Age of Onset  . Arthritis Mother   . Depression Mother   . Alcohol abuse Father   . Hypertension Father   . Hyperlipidemia Father   . Cancer Maternal Aunt   . Cancer Maternal Uncle   . Cancer Maternal Grandmother   . Diabetes Maternal Grandmother   . Alcohol abuse Sister   . Depression Sister     SOCIAL HISTORY: Social History   Socioeconomic History  . Marital status: Married    Spouse name: Laure Kidney  . Number of children: 2  . Years of education: 73  . Highest education level: Not on file  Occupational History    Comment: [employer]  Social Needs  . Financial resource strain: Not on file  . Food insecurity    Worry: Not on file    Inability: Not on file  . Transportation needs    Medical: Not on file    Non-medical: Not on file  Tobacco Use  . Smoking status: Former  Smoker    Types: Cigarettes  . Smokeless tobacco: Former Neurosurgeon    Types: Chew  Substance and Sexual Activity  . Alcohol use: Yes    Comment: every once in a while   . Drug use: No  . Sexual activity: Not on file  Lifestyle  . Physical activity    Days per week: Not on file    Minutes per session: Not on file  . Stress: Not on file  Relationships  . Social Musician on phone: Not on file    Gets together: Not on file     Attends religious service: Not on file    Active member of club or organization: Not on file    Attends meetings of clubs or organizations: Not on file    Relationship status: Not on file  . Intimate partner violence    Fear of current or ex partner: Not on file    Emotionally abused: Not on file    Physically abused: Not on file    Forced sexual activity: Not on file  Other Topics Concern  . Not on file  Social History Narrative   Patient lives at home with his wife Florentina Addison) Patient works full time.   Right handed.   Bachelors in Chief Financial Officer. Works in Airline pilot.   Caffeine- two to four cups daily.   Patient has 2 children.        PHYSICAL EXAM  Vitals:   12/26/18 1036  BP: 130/87  Pulse: 70  Temp: 98.2 F (36.8 C)  Weight: 194 lb (88 kg)  Height: 5\' 7"  (1.702 m)   Body mass index is 30.38 kg/m. Generalized: Well developed, obese male in no acute distress  Neck supple   Neurological examination   Mentation: Alert oriented to time, place, history taking. Follows all commands speech and language fluent.  Cranial nerve II-XII: Pupils were equal round reactive to light extraocular movements were full, visual field were full on confrontational test. Facial sensation and strength were normal. hearing was intact to finger rubbing bilaterally.   head turning and shoulder shrug were normal and symmetric  Motor: normal bulk and tone, full strength in the BUE, BLE, fine finger movements normal, no pronator drift. No focal weakness Coordination: finger-nose-finger, heel-to-shin bilaterally, no dysmetria Reflexes: Brachioradialis 2/2, biceps 2/2, triceps 2/2, patellar 2/2, Achilles 2/2, plantar responses were flexor bilaterally. Gait and Station: Rising up from seated position without assistance, normal stance, moderate stride, good arm swing, smooth turning, able to perform tiptoe, and heel walking without difficulty. Tandem gait is steady  DIAGNOSTIC DATA (LABS, IMAGING, TESTING) - I  reviewed patient records, labs, notes, testing and imaging myself where available.  Lab Results  Component Value Date   WBC 4.9 07/09/2015   HGB 14.3 07/09/2015   HCT 43.6 07/09/2015   MCV 89 07/09/2015   PLT 279 07/09/2015      Component Value Date/Time   NA 140 10/25/2017 1112   NA 143 07/09/2015 0836   K 4.3 10/25/2017 1112   CL 104 10/25/2017 1112   CO2 30 10/25/2017 1112   GLUCOSE 91 10/25/2017 1112   BUN 16 10/25/2017 1112   BUN 15 07/09/2015 0836   CREATININE 1.25 10/25/2017 1112   CALCIUM 10.4 10/25/2017 1112   PROT 7.2 10/25/2017 1112   PROT 6.6 07/09/2015 0836   ALBUMIN 4.6 10/25/2017 1112   ALBUMIN 4.9 07/09/2015 0836   AST 22 10/25/2017 1112   ALT 33 10/25/2017 1112   ALKPHOS  46 10/25/2017 1112   BILITOT 0.6 10/25/2017 1112   BILITOT 0.4 07/09/2015 0836   GFRNONAA 87 07/09/2015 0836   GFRAA 100 07/09/2015 0836    ASSESSMENT AND PLAN  Complex partial seizure Chronic migraine headaches  MRI of the brain previously showed 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 Ph.D.  [facility] [address] [place], [place] [address] Phone: [phone] Fax:      [phone]

## 2019-01-26 DIAGNOSIS — Z20828 Contact with and (suspected) exposure to other viral communicable diseases: Secondary | ICD-10-CM | POA: Diagnosis not present

## 2019-10-02 DIAGNOSIS — M25561 Pain in right knee: Secondary | ICD-10-CM | POA: Diagnosis not present

## 2019-10-16 DIAGNOSIS — R2689 Other abnormalities of gait and mobility: Secondary | ICD-10-CM | POA: Insufficient documentation

## 2019-10-16 DIAGNOSIS — M25661 Stiffness of right knee, not elsewhere classified: Secondary | ICD-10-CM | POA: Diagnosis not present

## 2019-10-16 DIAGNOSIS — M25561 Pain in right knee: Secondary | ICD-10-CM | POA: Diagnosis not present

## 2019-10-18 DIAGNOSIS — R2689 Other abnormalities of gait and mobility: Secondary | ICD-10-CM | POA: Diagnosis not present

## 2019-10-18 DIAGNOSIS — M25561 Pain in right knee: Secondary | ICD-10-CM | POA: Diagnosis not present

## 2019-10-18 DIAGNOSIS — M25661 Stiffness of right knee, not elsewhere classified: Secondary | ICD-10-CM | POA: Diagnosis not present

## 2019-10-29 DIAGNOSIS — R2689 Other abnormalities of gait and mobility: Secondary | ICD-10-CM | POA: Diagnosis not present

## 2019-10-29 DIAGNOSIS — M25661 Stiffness of right knee, not elsewhere classified: Secondary | ICD-10-CM | POA: Diagnosis not present

## 2019-10-29 DIAGNOSIS — M25561 Pain in right knee: Secondary | ICD-10-CM | POA: Diagnosis not present

## 2019-11-05 DIAGNOSIS — M25561 Pain in right knee: Secondary | ICD-10-CM | POA: Diagnosis not present

## 2019-11-12 DIAGNOSIS — M222X1 Patellofemoral disorders, right knee: Secondary | ICD-10-CM | POA: Diagnosis not present

## 2020-01-01 ENCOUNTER — Encounter: Payer: Self-pay | Admitting: Neurology

## 2020-01-01 ENCOUNTER — Ambulatory Visit: Payer: BC Managed Care – PPO | Admitting: Neurology

## 2020-01-01 VITALS — BP 122/74 | HR 76 | Ht 67.0 in | Wt 193.8 lb

## 2020-01-01 DIAGNOSIS — G40209 Localization-related (focal) (partial) symptomatic epilepsy and epileptic syndromes with complex partial seizures, not intractable, without status epilepticus: Secondary | ICD-10-CM

## 2020-01-01 DIAGNOSIS — G43909 Migraine, unspecified, not intractable, without status migrainosus: Secondary | ICD-10-CM | POA: Insufficient documentation

## 2020-01-01 MED ORDER — LAMOTRIGINE 100 MG PO TABS: ORAL_TABLET | ORAL | 4 refills | Status: DC

## 2020-01-01 MED ORDER — NORTRIPTYLINE HCL 10 MG PO CAPS: ORAL_CAPSULE | ORAL | 1 refills | Status: DC

## 2020-01-01 MED ORDER — SUMATRIPTAN SUCCINATE 50 MG PO TABS: 50.0000 mg | ORAL_TABLET | ORAL | 3 refills | Status: DC | PRN

## 2020-01-01 NOTE — Progress Notes (Signed)
PATIENT: Tony Valentine DOB: 1980-06-22  REASON FOR VISIT: follow up HISTORY FROM: patient  HISTORY OF PRESENT ILLNESS: Today 01/01/20  HISTORY  [name] is a 39 year old male, with history of of complex partial seizure disorder and is currently on lamotrigine 150 twice a day. Last seizure was in November 2013.  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 His EEG was normal. He is tolerating his lamotrigine without side effects.  He also reported long history of chronic migraine headache, his typical migraine are left retro-orbital area severe pressure, only headache with associated light noise sensitivity, nauseous, lasting for few hours.  He now complains of increased frequency of migraine headaches, couple times each month, he has never tried triptan treatment in the past, he is using over-the-counter medication with partial relief, sleep is always helpful  Update January 01, 2020 SS: Here today for follow-up, no recurrent seizure, doing well on Lamictal 150 mg twice daily. Continues with migraines, 3-4 a month, near daily mild to moderate headache. Headaches usually located right or left occipital area or right frontal. Imitrex usually works well, sometimes has to repeat dose, with bad migraine, will take shower, lay down, sleep. Not sleeping well, trouble falling asleep, only sleeping 5 hours at night. 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   REVIEW OF SYSTEMS: Out of a complete 14 system review of symptoms, the patient complains only of the following  symptoms, and all other reviewed systems are negative.  Headache  ALLERGIES: Allergies  Allergen Reactions   Amoxil [Amoxicillin] Hives and Shortness Of Breath   Penicillins Hives and Shortness Of Breath    Has patient had a PCN reaction causing immediate rash, facial/tongue/throat swelling, SOB or lightheadedness with hypotension: Yes Has patient had a PCN reaction causing severe rash involving mucus membranes or skin necrosis: No Has patient had a PCN reaction that required hospitalization: No Has patient had a PCN reaction occurring within the last 10 years: No If all of the above answers are "NO", then may proceed with Cephalosporin use.    Shellfish Allergy Anaphylaxis    HOME MEDICATIONS: Outpatient Medications Prior to Visit  Medication Sig Dispense Refill   meloxicam (MOBIC) 15 MG tablet Take 15 mg by mouth daily.     lamoTRIgine (LAMICTAL) 100 MG tablet TAKE 1.5 TABLETS (150 MG TOTAL) BY MOUTH 2 (TWO) TIMES DAILY. 270 tablet 4   SUMAtriptan (IMITREX) 50 MG tablet Take 1 tablet (50 mg total) by mouth every 2 (two) hours as needed for migraine. May repeat in 2 hours if headache persists or recurs. 30 tablet 3   No facility-administered medications prior to visit.    PAST MEDICAL HISTORY: Past Medical History:  Diagnosis Date   Depression    Migraine    Seizures (HCC)    possible sz 09/19/16    PAST SURGICAL HISTORY: Past Surgical History:  Procedure Laterality Date   REFRACTIVE SURGERY      FAMILY HISTORY: Family History  Problem Relation Age of Onset   Arthritis Mother    Depression Mother    Alcohol abuse Father    Hypertension Father  Hyperlipidemia Father    Cancer Maternal Aunt    Cancer Maternal Uncle    Cancer Maternal Grandmother    Diabetes Maternal Grandmother    Alcohol abuse Sister    Depression Sister     SOCIAL HISTORY: Social History   Socioeconomic History   Marital status: Married    Spouse name: Florentina Addison    Number of children: 2   Years of education: 12   Highest education level: Not on file  Occupational History    Comment: [employer]  Tobacco Use   Smoking status: Former Smoker    Types: Cigarettes   Smokeless tobacco: Former Neurosurgeon    Types: Chew  Substance and Sexual Activity   Alcohol use: Yes    Comment: every once in a while    Drug use: No   Sexual activity: Not on file  Other Topics Concern   Not on file  Social History Narrative   Patient lives at home with his wife Florentina Addison) Patient works full time.   Right handed.   Bachelors in Chief Financial Officer. Works in Airline pilot.   Caffeine- two to four cups daily.   Patient has 2 children.      Social Determinants of Health   Financial Resource Strain:    Difficulty of Paying Living Expenses: Not on file  Food Insecurity:    Worried About Programme researcher, broadcasting/film/video in the Last Year: Not on file   [employer] of Food in the Last Year: Not on file  Transportation Needs:    Lack of Transportation (Medical): Not on file   Lack of Transportation (Non-Medical): Not on file  Physical Activity:    Days of Exercise per Week: Not on file   Minutes of Exercise per Session: Not on file  Stress:    Feeling of Stress : Not on file  Social Connections:    Frequency of Communication with Friends and Family: Not on file   Frequency of Social Gatherings with Friends and Family: Not on file   Attends Religious Services: Not on file   Active Member of Clubs or Organizations: Not on file   Attends Banker Meetings: Not on file   Marital Status: Not on file  Intimate Partner Violence:    Fear of Current or Ex-Partner: Not on file   Emotionally Abused: Not on file   Physically Abused: Not on file   Sexually Abused: Not on file   PHYSICAL EXAM  Vitals:   01/01/20 0746  BP: 122/74  Pulse: 76  Weight: 193 lb 12.8 oz (87.9 kg)  Height: 5\' 7"  (1.702 m)   Body mass index is 30.35 kg/m.  Generalized: Well  developed, in no acute distress   Neurological examination  Mentation: Alert oriented to time, place, history taking. Follows all commands speech and language fluent Cranial nerve II-XII: Pupils were equal round reactive to light. Extraocular movements were full, visual field were full on confrontational test. Facial sensation and strength were normal. Head turning and shoulder shrug were normal and symmetric. Motor: The motor testing reveals 5 over 5 strength of all 4 extremities. Good symmetric motor tone is noted throughout.  Sensory: Sensory testing is intact to soft touch on all 4 extremities. No evidence of extinction is noted.  Coordination: Cerebellar testing reveals good finger-nose-finger and heel-to-shin bilaterally.  Gait and station: Gait is normal.  Reflexes: Deep tendon reflexes are symmetric and normal bilaterally.   DIAGNOSTIC DATA (LABS, IMAGING, TESTING) - I reviewed patient records, labs, notes,  testing and imaging myself where available.  Lab Results  Component Value Date   WBC 4.9 07/09/2015   HGB 14.3 07/09/2015   HCT 43.6 07/09/2015   MCV 89 07/09/2015   PLT 279 07/09/2015      Component Value Date/Time   NA 140 10/25/2017 1112   NA 143 07/09/2015 0836   K 4.3 10/25/2017 1112   CL 104 10/25/2017 1112   CO2 30 10/25/2017 1112   GLUCOSE 91 10/25/2017 1112   BUN 16 10/25/2017 1112   BUN 15 07/09/2015 0836   CREATININE 1.25 10/25/2017 1112   CALCIUM 10.4 10/25/2017 1112   PROT 7.2 10/25/2017 1112   PROT 6.6 07/09/2015 0836   ALBUMIN 4.6 10/25/2017 1112   ALBUMIN 4.9 07/09/2015 0836   AST 22 10/25/2017 1112   ALT 33 10/25/2017 1112   ALKPHOS 46 10/25/2017 1112   BILITOT 0.6 10/25/2017 1112   BILITOT 0.4 07/09/2015 0836   GFRNONAA 87 07/09/2015 0836   GFRAA 100 07/09/2015 0836   Lab Results  Component Value Date   CHOL 174 10/25/2017   HDL 38.90 (L) 10/25/2017   LDLCALC 114 (H) 10/25/2017   TRIG 108.0 10/25/2017   CHOLHDL 4 10/25/2017   Lab  Results  Component Value Date   HGBA1C 5.2 10/25/2017   No results found for: VITAMINB12 No results found for: TSH  ASSESSMENT AND PLAN 39 y.o. year old male  has a past medical history of Depression, Migraine, and Seizures (HCC). here with:  1.  Complex partial seizure 2.  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  This included previsit chart review, lab review, study review, order entry, electronic health record documentation, patient education.  Margie Ege, AGNP-C, DNP 01/01/2020, 8:12 AM [facility] [address], [address] [place], [place] [address] [phone]

## 2020-01-01 NOTE — Patient Instructions (Signed)
Try nortriptyline starting at 10 mg at bedtime x 1 week, then take 20 mg, stay at that dose for migraine headache prevention  Continue the Lamictal, call for seizure activity  Take Imitrex for acute headache See you back in 6 months

## 2020-01-05 ENCOUNTER — Encounter (HOSPITAL_COMMUNITY): Payer: Self-pay | Admitting: Emergency Medicine

## 2020-01-05 ENCOUNTER — Other Ambulatory Visit: Payer: Self-pay

## 2020-01-05 ENCOUNTER — Emergency Department (HOSPITAL_COMMUNITY)
Admission: EM | Admit: 2020-01-05 | Discharge: 2020-01-05 | Disposition: A | Discharge status: Home / Self Care | Payer: BC Managed Care – PPO | Attending: Emergency Medicine | Admitting: Emergency Medicine

## 2020-01-05 DIAGNOSIS — T43015A Adverse effect of tricyclic antidepressants, initial encounter: Secondary | ICD-10-CM | POA: Insufficient documentation

## 2020-01-05 DIAGNOSIS — G2571 Drug induced akathisia: Secondary | ICD-10-CM | POA: Insufficient documentation

## 2020-01-05 DIAGNOSIS — R202 Paresthesia of skin: Secondary | ICD-10-CM | POA: Insufficient documentation

## 2020-01-05 DIAGNOSIS — Z87891 Personal history of nicotine dependence: Secondary | ICD-10-CM | POA: Insufficient documentation

## 2020-01-05 DIAGNOSIS — R519 Headache, unspecified: Secondary | ICD-10-CM | POA: Diagnosis not present

## 2020-01-05 DIAGNOSIS — R451 Restlessness and agitation: Secondary | ICD-10-CM | POA: Diagnosis not present

## 2020-01-05 DIAGNOSIS — G4489 Other headache syndrome: Secondary | ICD-10-CM | POA: Diagnosis not present

## 2020-01-05 DIAGNOSIS — I10 Essential (primary) hypertension: Secondary | ICD-10-CM | POA: Diagnosis not present

## 2020-01-05 DIAGNOSIS — T50905A Adverse effect of unspecified drugs, medicaments and biological substances, initial encounter: Secondary | ICD-10-CM

## 2020-01-05 MED ORDER — SODIUM CHLORIDE 0.9 % IV BOLUS: 1000.0000 mL | Freq: Once | INTRAVENOUS | Status: DC

## 2020-01-05 NOTE — ED Notes (Signed)
Performed discharge instruction. Provided patient with discharge paperwork. AFVSS. Ivs removed. Pt escorted to POV

## 2020-01-05 NOTE — Discharge Instructions (Signed)
Your history and exam and recent medication initiation are consistent with a medication reaction from the nortriptyline. Please stop taking it and call your doctor tomorrow to discuss changing to a different headache medication and sleep medication. We suspect this caused the tingling sensation, the agitation, the anxious and panic feeling, and the restlessness. This also may have provoked a brief seizure. Given her well appearance and stability for over 2 and half hours, we did feel you are safe for discharge home. We offered some labs and fluids however as you are able to eat and drink well, we feel it is reasonable to hydrate at home. If any symptoms change, worsen, or return, please return to the nearest emergency department.

## 2020-01-05 NOTE — ED Provider Notes (Signed)
[facility] Provider Note   CSN: [id number] Arrival date & time: 01/05/20  1427     History Chief Complaint  Patient presents with  . Headache    Tony Valentine is a 39 y.o. male.  The history is provided by the patient, the spouse and medical records. No language interpreter was used.  Anxiety This is a new problem. The current episode started 6 to 12 hours ago. The problem occurs constantly. The problem has been rapidly improving. Associated symptoms include headaches. Pertinent negatives include no chest pain, no abdominal pain and no shortness of breath. Nothing aggravates the symptoms. Nothing relieves the symptoms. He has tried nothing for the symptoms. The treatment provided no relief.       Past Medical History:  Diagnosis Date  . Depression   . Migraine   . Seizures (HCC)    possible sz 09/19/16    Patient Active Problem List   Diagnosis Date Noted  . Migraine   . Partial symptomatic epilepsy with complex partial seizures, not intractable, without status epilepticus (HCC) 12/26/2018  . Chronic knee pain 10/25/2017  . Preventative health care 10/25/2017  . Rash and nonspecific skin eruption 10/25/2017  . Generalized headaches 07/09/2015  . History of migraine 07/09/2015  . Somnolence, daytime 07/10/2014  . Encounter for therapeutic drug monitoring 01/07/2013  . Seizure disorder, complex partial (HCC) 07/06/2012    Past Surgical History:  Procedure Laterality Date  . REFRACTIVE SURGERY         Family History  Problem Relation Age of Onset  . Arthritis Mother   . Depression Mother   . Alcohol abuse Father   . Hypertension Father   . Hyperlipidemia Father   . Cancer Maternal Aunt   . Cancer Maternal Uncle   . Cancer Maternal Grandmother   . Diabetes Maternal Grandmother   . Alcohol abuse Sister   . Depression Sister     Social History   Tobacco Use  . Smoking status: Former Smoker    Types:  Cigarettes  . Smokeless tobacco: Former NeurosurgeonUser    Types: Chew  Substance Use Topics  . Alcohol use: Yes    Comment: every once in a while   . Drug use: No    Home Medications Prior to Admission medications   Medication Sig Start Date End Date Taking? Authorizing Provider  lamoTRIgine (LAMICTAL) 100 MG tablet TAKE 1.5 TABLETS (150 MG TOTAL) BY MOUTH 2 (TWO) TIMES DAILY. 01/01/20   Glean SalvoSlack, Sarah J, NP  meloxicam (MOBIC) 15 MG tablet Take 15 mg by mouth daily.    [provider]  nortriptyline (PAMELOR) 10 MG capsule Take 1 at bedtime x 1 week, then take 2 01/01/20   Glean SalvoSlack, Sarah J, NP  SUMAtriptan (IMITREX) 50 MG tablet Take 1 tablet (50 mg total) by mouth every 2 (two) hours as needed for migraine. May repeat in 2 hours if headache persists or recurs. 01/01/20   Glean SalvoSlack, Sarah J, NP    Allergies    Amoxil [amoxicillin], Penicillins, and Shellfish allergy  Review of Systems   Review of Systems  Constitutional: Negative for chills, diaphoresis, fatigue and fever.  HENT: Negative for congestion.   Eyes: Negative for photophobia and visual disturbance.  Respiratory: Negative for cough, chest tightness, shortness of breath and wheezing.   Cardiovascular: Negative for chest pain and palpitations.  Gastrointestinal: Negative for abdominal pain, constipation, diarrhea, nausea and vomiting.  Genitourinary: Negative for dysuria.  Musculoskeletal: Negative for back pain  and neck pain.  Neurological: Positive for headaches. Negative for dizziness, weakness, light-headedness and numbness.  Psychiatric/Behavioral: Positive for agitation. Negative for confusion. The patient is nervous/anxious.   All other systems reviewed and are negative.   Physical Exam Updated Vital Signs BP 117/71   Pulse 64   Temp 98.4 F (36.9 C) (Oral)   Resp 15   Wt 87.1 kg   SpO2 98%   BMI 30.07 kg/m   Physical Exam Vitals and nursing note reviewed.  Constitutional:      General: He is not in acute  distress.    Appearance: He is well-developed. He is not ill-appearing, toxic-appearing or diaphoretic.  HENT:     Head: Normocephalic and atraumatic.     Mouth/Throat:     Mouth: Mucous membranes are moist.     Pharynx: Oropharynx is clear.  Eyes:     General: No scleral icterus.    Extraocular Movements: Extraocular movements intact.     Conjunctiva/sclera: Conjunctivae normal.     Pupils: Pupils are equal, round, and reactive to light. Pupils are equal.  Cardiovascular:     Rate and Rhythm: Normal rate and regular rhythm.     Heart sounds: No murmur heard.   Pulmonary:     Effort: Pulmonary effort is normal. No respiratory distress.     Breath sounds: Normal breath sounds. No wheezing, rhonchi or rales.  Chest:     Chest wall: No tenderness.  Abdominal:     General: There is no distension.     Palpations: Abdomen is soft.     Tenderness: There is no abdominal tenderness.  Musculoskeletal:        General: No swelling or tenderness.     Cervical back: Neck supple. No rigidity.  Skin:    General: Skin is warm and dry.     Capillary Refill: Capillary refill takes less than 2 seconds.     Findings: No erythema.  Neurological:     Mental Status: He is alert and oriented to person, place, and time. Mental status is at baseline.     GCS: GCS eye subscore is 4. GCS verbal subscore is 5. GCS motor subscore is 6.     Cranial Nerves: No cranial nerve deficit, dysarthria or facial asymmetry.     Sensory: No sensory deficit.     Motor: No weakness.     Coordination: Coordination normal.  Psychiatric:        Mood and Affect: Mood normal. Mood is not anxious.        Behavior: Behavior normal. Behavior is not agitated.     ED Results / Procedures / Treatments   Labs (all labs ordered are listed, but only abnormal results are displayed) Labs Reviewed - No data to display  EKG None  Radiology No results found.  Procedures Procedures (including critical care  time)  Medications Ordered in ED Medications - No data to display  ED Course  I have reviewed the triage vital signs and the nursing notes.  Pertinent labs & imaging results that were available during my care of the patient were reviewed by me and considered in my medical decision making (see chart for details).    MDM Rules/Calculators/A&P                          Tony Valentine is a 39 y.o. male with a past medical history significant for epilepsy and complex partial seizure disorder on lamotrigine, migraines,  and depression who presents for agitation and possible seizure. Patient reports that he was started on nortriptyline and took his first dose yesterday to help with headaches. He reports he got a great night sleep last night but this morning, at lunch, he began getting very agitated. He reports that he felt that his muscles needed to move, he started having twitching, and he felt that "there was a sparkler in the back of my head" that felt tingling in going around his head. He reports he did not have any more headache than his baseline headaches. He then left the restaurant with his wife driving and he does not know what happened but thinks that he may have had a seizure as he passed out, twitch, and had some snoring breathing. This only lasted for several minutes and he was feeling better. Patient was then pacing around and agitated. He does feel like he is doing better upon arrival to the emergency department. He has been here for around 2 hours prior to my evaluation. He reports that last week he had some diarrhea and felt may be dehydrated but otherwise denies any trauma. Denies any other neurologic deficits or complaints.  On exam, lungs clear and chest nontender abdomen nontender. No focal neurologic deficits normal sensation and strength and pulse in extremities. Normal finger-nose-finger testing. Clear speech, pupils are symmetric reactive normal extraocular movements. No  facial droop. No rash appreciated on his skin exam.  I touch base with neurology to discuss and neurology feels that this is likely a reaction to the nortriptyline causing akathisia and the agitation feeling. This also likely caused the tingling and may have provoked a short seizure. Neurology felt it was reasonable to have him stop the medication and call his neurology team tomorrow to discuss pivoting to different medication for his headache. We offered patient labs to look for electrolyte imbalance or other electrolyte abnormalities as well as some dehydration as his mucous membranes were dry likely from the recent diarrhea and decreased oral intake. Patient says he does not with fluids and can drink at home. Low suspicion for other more concerning reaction such as Stevens-Johnson, serotonin syndrome, or other reaction. Now that he feels better and suspects is due to the medication which he will avoid, he wants to go home.  Patient understood plan to call his neurology team and follow-up as well as return precautions if things change or worsen or return. He and family no depressions or concerns and patient was discharged in good condition.   Final Clinical Impression(s) / ED Diagnoses Final diagnoses:  Adverse effect of drug, initial encounter  Agitation  Acute medication-induced akathisia  Tingling    Rx / DC Orders ED Discharge Orders    None      Clinical Impression: 1. Adverse effect of drug, initial encounter   2. Agitation   3. Acute medication-induced akathisia   4. Tingling     Disposition: Discharge  Condition: Good  I have discussed the results, Dx and Tx plan with the pt(& family if present). He/she/they expressed understanding and agree(s) with the plan. Discharge instructions discussed at great length. Strict return precautions discussed and pt &/or family have verbalized understanding of the instructions. No further questions at time of discharge.    New  Prescriptions   No medications on file    Follow Up: your neurology     Doreene Nest, NP [address] [place] [address] [phone]     [facility] EMERGENCY [facility]PARTMENT [address] [id number] mc [place] [address] [phone]       Riggins Cisek, Canary Brim, MD 01/05/20 [phone]

## 2020-01-05 NOTE — ED Notes (Signed)
MD to bedside. Pt resting in bed. HA improving

## 2020-01-05 NOTE — ED Triage Notes (Signed)
Pt to triage via [facility] from home.  Reports "fireworks in head" since waking up at 1pm.  States "this isn't a headache".  States it feels like "acid without hallucinations" and he reports "heavy legs".  No arm drift.  States he just wanted to "get naked and walk around".

## 2020-01-06 ENCOUNTER — Telehealth: Payer: Self-pay | Admitting: Neurology

## 2020-01-06 NOTE — Telephone Encounter (Signed)
Pt called stating that he ended up having to got the the ER due to a reaction to his nortriptyline (PAMELOR) 10 MG capsule and is needing to speak to the RN regarding this. Please advise.

## 2020-01-06 NOTE — Telephone Encounter (Signed)
I called pt and LMVM for him that receeived message and saw ED note relating to reaction to nortriptyline.

## 2020-01-06 NOTE — Telephone Encounter (Signed)
He should stop the nortriptyline as he already has done. Stay off the medication. Was given to help headaches, let's stay off migraine medication preventative for now, maybe Topamax in the future. He was taking 10 mg nortriptyline. After you talk to him, let me know if anything else is reported.

## 2020-01-07 NOTE — Telephone Encounter (Signed)
I called pt and relayed we got message.  It was placed in allergies.  He will call [facility] to cancel refills.  He will discard at med disposal site when back in [email or web address] (will google sites).  He was back to baseline normal.  He stated GM had issues with this medication as well.  I relayed that topamax option althougn per SS/NP will hold off on preventative for now.  He has appt 07-01-20 to [facility].  To call back sooner if needed.

## 2020-01-14 NOTE — Progress Notes (Signed)
I have reviewed and agreed above plan. 

## 2020-03-11 DIAGNOSIS — Z20828 Contact with and (suspected) exposure to other viral communicable diseases: Secondary | ICD-10-CM | POA: Diagnosis not present

## 2020-06-30 NOTE — Progress Notes (Signed)
PATIENT: Tony Valentine DOB: [birth year]  REASON FOR VISIT: follow up HISTORY FROM: patient  HISTORY OF PRESENT ILLNESS: Today 07/01/20  HISTORY  [name] is a 40 year old male, with history of of complex partial seizure disorder and is currently on lamotrigine 150 twice a day. Last seizure was in November 2013.  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 His EEG was normal. He is tolerating his lamotrigine without side effects.  He also reported long history of chronic migraine headache, his typical migraine are left retro-orbital area severe pressure, only headache with associated light noise sensitivity, nauseous, lasting for few hours.  He now complains of increased frequency of migraine headaches, couple times each month, he has never tried triptan treatment in the past, he is using over-the-counter medication with partial relief, sleep is always helpful  Update January 01, 2020 SS: Here today for follow-up, no recurrent seizure, doing well on Lamictal 150 mg twice daily. Continues with migraines, 3-4 a month, near daily mild to moderate headache. Headaches usually located right or left occipital area or right frontal. Imitrex usually works well, sometimes has to repeat dose, with bad migraine, will take shower, lay down, sleep. Not sleeping well, trouble falling asleep, only sleeping 5 hours at night. 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   Update July 01, 2020 SS: Tried nortriptyline at last visit for migraines, reported akathisia after taking once (felt  hot, itchy, anxiety took over him, tingling feeling all over, muscles tightened up, legs kicking, lasted a few hours), went to ER, stopped the medication, there was no seizure, given fluid in ER.  Remains on Lamictal 150 mg twice daily, no recurrent seizures since last seen.  For migraines, takes Imitrex as needed, under good control.  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  Has lost 10 pounds, eating better, working out.  REVIEW OF SYSTEMS: Out of a complete 14 system review of symptoms, the patient complains only of the following symptoms, and all other reviewed systems are negative.  Headache  ALLERGIES: Allergies  Allergen Reactions  . Amoxil [Amoxicillin] Hives and Shortness Of Breath  . Penicillins Hives and Shortness Of Breath    Has patient had a PCN reaction causing immediate rash, facial/tongue/throat swelling, SOB or lightheadedness with hypotension: Yes Has patient had a PCN reaction causing severe rash involving mucus membranes or skin necrosis: No Has patient had a PCN reaction that required hospitalization: No Has patient had a PCN reaction occurring within the last 10 years: No If all of the above answers are "NO", then may proceed with Cephalosporin use.   . Shellfish Allergy Anaphylaxis  . Nortriptyline     akathisia    HOME MEDICATIONS: Outpatient Medications Prior to Visit  Medication Sig Dispense Refill  . meloxicam (MOBIC) 15 MG tablet Take 15 mg by mouth daily.    Marland Kitchen lamoTRIgine (LAMICTAL) 100 MG tablet TAKE 1.5 TABLETS (150 MG TOTAL) BY MOUTH 2 (TWO) TIMES DAILY. 270 tablet 4  . SUMAtriptan (IMITREX) 50 MG tablet Take 1 tablet (50 mg total) by mouth every 2 (two) hours  as needed for migraine. May repeat in 2 hours if headache persists or recurs. 30 tablet 3   No facility-administered medications prior to visit.    PAST MEDICAL HISTORY: Past Medical History:  Diagnosis Date  . Depression   .  Migraine   . Seizures (HCC)    possible sz 09/19/16    PAST SURGICAL HISTORY: Past Surgical History:  Procedure Laterality Date  . REFRACTIVE SURGERY      FAMILY HISTORY: Family History  Problem Relation Age of Onset  . Arthritis Mother   . Depression Mother   . Alcohol abuse Father   . Hypertension Father   . Hyperlipidemia Father   . Cancer Maternal Aunt   . Cancer Maternal Uncle   . Cancer Maternal Grandmother   . Diabetes Maternal Grandmother   . Alcohol abuse Sister   . Depression Sister     SOCIAL HISTORY: Social History   Socioeconomic History  . Marital status: Married    Spouse name: Florentina Addison  . Number of children: 2  . Years of education: 78  . Highest education level: Not on file  Occupational History    Comment: [employer]  Tobacco Use  . Smoking status: Former Smoker    Types: Cigarettes  . Smokeless tobacco: Former Neurosurgeon    Types: Chew  Substance and Sexual Activity  . Alcohol use: Yes    Comment: every once in a while   . Drug use: No  . Sexual activity: Not on file  Other Topics Concern  . Not on file  Social History Narrative   Patient lives at home with his wife Florentina Addison) Patient works full time.   Right handed.   Bachelors in Chief Financial Officer. Works in Airline pilot.   Caffeine- two to four cups daily.   Patient has 2 children.      Social Determinants of Health   Financial Resource Strain: Not on file  Food Insecurity: Not on file  Transportation Needs: Not on file  Physical Activity: Not on file  Stress: Not on file  Social Connections: Not on file  Intimate Partner Violence: Not on file   PHYSICAL EXAM  Vitals:   07/01/20 0748  BP: 102/61  Pulse: (!) 57  Weight: 190 lb (86.2 kg)  Height: 5\' 7"  (1.702 m)   Body mass index is 29.76 kg/m.  Generalized: Well developed, in no acute distress   Neurological examination  Mentation: Alert oriented to time, place, history taking. Follows all commands speech and language fluent Cranial  nerve II-XII: Pupils were equal round reactive to light. Extraocular movements were full, visual field were full on confrontational test. Facial sensation and strength were normal. Head turning and shoulder shrug were normal and symmetric. Motor: The motor testing reveals 5 over 5 strength of all 4 extremities. Good symmetric motor tone is noted throughout.  Sensory: Sensory testing is intact to soft touch on all 4 extremities. No evidence of extinction is noted.  Coordination: Cerebellar testing reveals good finger-nose-finger and heel-to-shin bilaterally.  Gait and station: Gait is normal.  Reflexes: Deep tendon reflexes are symmetric and normal bilaterally.   DIAGNOSTIC DATA (LABS, IMAGING, TESTING) - I reviewed patient records, labs, notes, testing and imaging myself where available.  Lab Results  Component Value Date   WBC 4.9 07/09/2015   HGB 14.3 07/09/2015   HCT 43.6 07/09/2015   MCV 89 07/09/2015   PLT 279 07/09/2015      Component Value Date/Time   NA 140 10/25/2017 1112  NA 143 07/09/2015 0836   K 4.3 10/25/2017 1112   CL 104 10/25/2017 1112   CO2 30 10/25/2017 1112   GLUCOSE 91 10/25/2017 1112   BUN 16 10/25/2017 1112   BUN 15 07/09/2015 0836   CREATININE 1.25 10/25/2017 1112   CALCIUM 10.4 10/25/2017 1112   PROT 7.2 10/25/2017 1112   PROT 6.6 07/09/2015 0836   ALBUMIN 4.6 10/25/2017 1112   ALBUMIN 4.9 07/09/2015 0836   AST 22 10/25/2017 1112   ALT 33 10/25/2017 1112   ALKPHOS 46 10/25/2017 1112   BILITOT 0.6 10/25/2017 1112   BILITOT 0.4 07/09/2015 0836   GFRNONAA 87 07/09/2015 0836   GFRAA 100 07/09/2015 0836   Lab Results  Component Value Date   CHOL 174 10/25/2017   HDL 38.90 (L) 10/25/2017   LDLCALC 114 (H) 10/25/2017   TRIG 108.0 10/25/2017   CHOLHDL 4 10/25/2017   Lab Results  Component Value Date   HGBA1C 5.2 10/25/2017   No results found for: VITAMINB12 No results found for: TSH  ASSESSMENT AND PLAN 40 y.o. year old male  has a past  medical history of Depression, Migraine, and Seizures (HCC). here with:  1.  Complex partial seizure 2.  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  This included previsit chart review, lab review, study review, order entry, electronic health record documentation, patient education.  Margie Ege, AGNP-C, DNP 07/01/2020, 8:08 AM [facility] [address], [address] [place], [place] [address] [phone]

## 2020-07-01 ENCOUNTER — Encounter: Payer: Self-pay | Admitting: Neurology

## 2020-07-01 ENCOUNTER — Ambulatory Visit: Payer: BC Managed Care – PPO | Admitting: Neurology

## 2020-07-01 VITALS — BP 102/61 | HR 57 | Ht 67.0 in | Wt 190.0 lb

## 2020-07-01 DIAGNOSIS — G40209 Localization-related (focal) (partial) symptomatic epilepsy and epileptic syndromes with complex partial seizures, not intractable, without status epilepticus: Secondary | ICD-10-CM

## 2020-07-01 DIAGNOSIS — G43909 Migraine, unspecified, not intractable, without status migrainosus: Secondary | ICD-10-CM | POA: Diagnosis not present

## 2020-07-01 MED ORDER — SUMATRIPTAN SUCCINATE 50 MG PO TABS: 50.0000 mg | ORAL_TABLET | ORAL | 3 refills | Status: DC | PRN

## 2020-07-01 MED ORDER — LAMOTRIGINE 100 MG PO TABS: ORAL_TABLET | ORAL | 4 refills | Status: DC

## 2020-07-01 NOTE — Patient Instructions (Signed)
Continue current medications  Call for worsening headaches/seizures See you back in 1 year

## 2020-07-02 ENCOUNTER — Telehealth: Payer: Self-pay | Admitting: *Deleted

## 2020-07-02 LAB — CBC WITH DIFFERENTIAL/PLATELET
Basophils Absolute: 0 10*3/uL (ref 0.0–0.2)
Basos: 1 %
EOS (ABSOLUTE): 0.2 10*3/uL (ref 0.0–0.4)
Eos: 3 %
Hematocrit: 42.9 % (ref 37.5–51.0)
Hemoglobin: 14.5 g/dL (ref 13.0–17.7)
Immature Grans (Abs): 0 10*3/uL (ref 0.0–0.1)
Immature Granulocytes: 1 %
Lymphocytes Absolute: 2 10*3/uL (ref 0.7–3.1)
Lymphs: 35 %
MCH: 29.2 pg (ref 26.6–33.0)
MCHC: 33.8 g/dL (ref 31.5–35.7)
MCV: 87 fL (ref 79–97)
Monocytes Absolute: 0.6 10*3/uL (ref 0.1–0.9)
Monocytes: 10 %
Neutrophils Absolute: 3.1 10*3/uL (ref 1.4–7.0)
Neutrophils: 50 %
Platelets: 255 10*3/uL (ref 150–450)
RBC: 4.96 x10E6/uL (ref 4.14–5.80)
RDW: 12.9 % (ref 11.6–15.4)
WBC: 5.9 10*3/uL (ref 3.4–10.8)

## 2020-07-02 LAB — COMPREHENSIVE METABOLIC PANEL
ALT: 17 IU/L (ref 0–44)
AST: 15 IU/L (ref 0–40)
Albumin/Globulin Ratio: 2.1 (ref 1.2–2.2)
Albumin: 4.5 g/dL (ref 4.0–5.0)
Alkaline Phosphatase: 52 IU/L (ref 44–121)
BUN/Creatinine Ratio: 17 (ref 9–20)
BUN: 20 mg/dL (ref 6–20)
Bilirubin Total: 0.4 mg/dL (ref 0.0–1.2)
CO2: 24 mmol/L (ref 20–29)
Calcium: 9.9 mg/dL (ref 8.7–10.2)
Chloride: 100 mmol/L (ref 96–106)
Creatinine, Ser: 1.17 mg/dL (ref 0.76–1.27)
Globulin, Total: 2.1 g/dL (ref 1.5–4.5)
Glucose: 85 mg/dL (ref 65–99)
Potassium: 4.3 mmol/L (ref 3.5–5.2)
Sodium: 139 mmol/L (ref 134–144)
Total Protein: 6.6 g/dL (ref 6.0–8.5)
eGFR: 82 mL/min/{1.73_m2} (ref >59)

## 2020-07-02 LAB — LAMOTRIGINE LEVEL: Lamotrigine Lvl: 6.6 ug/mL (ref 2.0–20.0)

## 2020-07-02 NOTE — Telephone Encounter (Signed)
Lab results came back normal.  Sent mychart message.

## 2020-11-25 NOTE — Progress Notes (Signed)
Chart reviewed, agree above plan ?

## 2021-07-03 ENCOUNTER — Other Ambulatory Visit: Payer: Self-pay | Admitting: Neurology

## 2021-07-05 NOTE — Telephone Encounter (Signed)
Rx refilled.

## 2021-07-07 ENCOUNTER — Ambulatory Visit: Payer: BC Managed Care – PPO | Admitting: Neurology

## 2021-07-07 ENCOUNTER — Encounter: Payer: Self-pay | Admitting: Neurology

## 2021-07-07 VITALS — BP 113/72 | HR 59 | Ht 67.0 in | Wt 172.0 lb

## 2021-07-07 DIAGNOSIS — G40209 Localization-related (focal) (partial) symptomatic epilepsy and epileptic syndromes with complex partial seizures, not intractable, without status epilepticus: Secondary | ICD-10-CM

## 2021-07-07 DIAGNOSIS — L409 Psoriasis, unspecified: Secondary | ICD-10-CM | POA: Insufficient documentation

## 2021-07-07 NOTE — Progress Notes (Signed)
? ? ?PATIENT: Tony Valentine ?DOB: [birth year] ? ?REASON FOR VISIT: follow up for seizures ?HISTORY FROM: patient ?PRIMARY NEUROLOGIST: Dr. Krista Blue ? ?HISTORY  ?Tony Valentine is a 41 year old male, with history of of complex partial seizure disorder and is currently on lamotrigine 150 twice a day. Last seizure was in November 2013.  He described his seizure and spotting feeling tightness, tunnel vision, transient zoning out, he did not have generalized seizure in the past, he currently work as a Secondary school teacher for [employer], drives regularly, ?  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 His EEG was normal. He is tolerating his lamotrigine without side effects. ?  ?He also reported long history of chronic migraine headache, his typical migraine are left retro-orbital area severe pressure, only headache with associated light noise sensitivity, nauseous, lasting for few hours. ?  ?He now complains of increased frequency of migraine headaches, couple times each month, he has never tried triptan treatment in the past, he is using over-the-counter medication with partial relief, sleep is always helpful ? ?Update January 01, 2020 SS: Here today for follow-up, no recurrent seizure, doing well on Lamictal 150 mg twice daily. Continues with migraines, 3-4 a month, near daily mild to moderate headache. Headaches usually located right or left occipital area or right frontal. Imitrex usually works well, sometimes has to repeat dose, with bad migraine, will take shower, lay down, sleep. Not sleeping well, trouble falling asleep, only sleeping 5 hours at night. Health is overall good, under more stress, wife started a home/commercial cleaning business, he is employed full time for [employer].  ? ?Update July 01, 2020 SS: Tried nortriptyline at last visit for migraines, reported akathisia after taking once (felt  hot, itchy, anxiety took over him, tingling feeling all over, muscles tightened up, legs kicking, lasted a few hours), went to ER, stopped the medication, there was no seizure, given fluid in ER.  Remains on Lamictal 150 mg twice daily, no recurrent seizures since last seen.  For migraines, takes Imitrex as needed, under good control.  Continues with full-time job for [employer], helps his wife with [place].  Has lost 10 pounds, eating better, working out. ? ?Update July 07, 2021 SS: Doing well, no recent seizures, remains on Lamictal 150 mg twice daily.  Migraines are under good control, on average 1 migraine monthly.  Only takes Imitrex if absolutely needed.  He is an Therapist, sports, busy with work. Feels like getting better rest.  No changes to health. ? ?REVIEW OF SYSTEMS: Out of a complete 14 system review of symptoms, the patient complains only of the following symptoms, and all other reviewed systems are negative. ? ?See HPI ? ?ALLERGIES: ?Allergies  ?Allergen Reactions  ? Amoxil [Amoxicillin] Hives and Shortness Of Breath  ? Penicillins Hives and Shortness Of Breath  ?  Has patient had a PCN reaction causing immediate rash, facial/tongue/throat swelling, SOB or lightheadedness with hypotension: Yes ?Has patient had a PCN reaction causing severe rash involving mucus membranes or skin necrosis: No ?Has patient had a PCN reaction that required hospitalization: No ?Has patient had a PCN reaction occurring within the last 10 years: No ?If all of the above answers are "NO", then may proceed with Cephalosporin use. ?  ? Shellfish Allergy Anaphylaxis  ? Nortriptyline   ?  akathisia  ? ? ?HOME MEDICATIONS: ?Outpatient Medications Prior to Visit  ?Medication Sig Dispense  Refill  ? lamoTRIgine (LAMICTAL) 100 MG tablet TAKE 1 AND 1/2 TABLETS BY  MOUTH TWICE DAILY 270 tablet 3  ? meloxicam (MOBIC) 15 MG tablet Take 15 mg by mouth daily.    ? SUMAtriptan (IMITREX) 50 MG tablet Take 1 tablet  (50 mg total) by mouth every 2 (two) hours as needed for migraine. May repeat in 2 hours if headache persists or recurs. 30 tablet 3  ? ?No facility-administered medications prior to visit.  ? ? ?PAST MEDICAL HISTORY: ?Past Medical History:  ?Diagnosis Date  ? Depression   ? Migraine   ? Seizures ([place])   ? possible sz 09/19/16  ? ? ?PAST SURGICAL HISTORY: ?Past Surgical History:  ?Procedure Laterality Date  ? REFRACTIVE SURGERY    ? ? ?FAMILY HISTORY: ?Family History  ?Problem Relation Age of Onset  ? Arthritis Mother   ? Depression Mother   ? Alcohol abuse Father   ? Hypertension Father   ? Hyperlipidemia Father   ? Cancer Maternal Aunt   ? Cancer Maternal Uncle   ? Cancer Maternal Grandmother   ? Diabetes Maternal Grandmother   ? Alcohol abuse Sister   ? Depression Sister   ? ? ?SOCIAL HISTORY: ?Social History  ? ?Socioeconomic History  ? Marital status: Married  ?  Spouse name: Tony Valentine  ? Number of children: 2  ? Years of education: 28  ? Highest education level: Not on file  ?Occupational History  ?  Comment: [employer]  ?Tobacco Use  ? Smoking status: Former  ?  Types: Cigarettes  ? Smokeless tobacco: Former  ?  Types: Chew  ?Substance and Sexual Activity  ? Alcohol use: Yes  ?  Comment: every once in a while   ? Drug use: No  ? Sexual activity: Not on file  ?Other Topics Concern  ? Not on file  ?Social History Narrative  ? Patient lives at home with his wife Tony Valentine) Patient works full time.  ? Right handed.  ? Bachelors in Pharmacologist. Works in Press photographer.  ? Caffeine- two to four cups daily.  ? Patient has 2 children.  ?   ? ?Social Determinants of Health  ? ?Financial Resource Strain: Not on file  ?Food Insecurity: Not on file  ?Transportation Needs: Not on file  ?Physical Activity: Not on file  ?Stress: Not on file  ?Social Connections: Not on file  ?Intimate Partner Violence: Not on file  ? ?PHYSICAL EXAM ? ?Vitals:  ? 07/07/21 0742  ?BP: 113/72  ?Pulse: (!) 59  ?Weight: 172 lb (78 kg)  ?Height: 5\' 7"   (1.702 m)  ? ? ?Body mass index is 26.94 kg/m?. ? ?Generalized: Well developed, in no acute distress  ? ?Neurological examination  ?Mentation: Alert oriented to time, place, history taking. Follows all commands speech and language fluent ?Cranial nerve II-XII: Pupils were equal round reactive to light. Extraocular movements were full, visual field were full on confrontational test. Facial sensation and strength were normal. Head turning and shoulder shrug were normal and symmetric. ?Motor: The motor testing reveals 5 over 5 strength of all 4 extremities. Good symmetric motor tone is noted throughout.  ?Sensory: Sensory testing is intact to soft touch on all 4 extremities. No evidence of extinction is noted.  ?Coordination: Cerebellar testing reveals good finger-nose-finger and heel-to-shin bilaterally.  ?Gait and station: Gait is normal.  ?Reflexes: Deep tendon reflexes are symmetric and normal bilaterally.  ? ?DIAGNOSTIC DATA (LABS, IMAGING, TESTING) ?- I reviewed patient records, labs, notes,  testing and imaging myself where available. ? ?Lab Results  ?Component Value Date  ? WBC 5.9 07/01/2020  ? HGB 14.5 07/01/2020  ? HCT 42.9 07/01/2020  ? MCV 87 07/01/2020  ? PLT 255 07/01/2020  ? ?   ?Component Value Date/Time  ? NA 139 07/01/2020 0811  ? K 4.3 07/01/2020 0811  ? CL 100 07/01/2020 0811  ? CO2 24 07/01/2020 0811  ? GLUCOSE 85 07/01/2020 0811  ? GLUCOSE 91 10/25/2017 1112  ? BUN 20 07/01/2020 0811  ? CREATININE 1.17 07/01/2020 0811  ? CALCIUM 9.9 07/01/2020 0811  ? PROT 6.6 07/01/2020 0811  ? ALBUMIN 4.5 07/01/2020 0811  ? AST 15 07/01/2020 0811  ? ALT 17 07/01/2020 0811  ? ALKPHOS 52 07/01/2020 0811  ? BILITOT 0.4 07/01/2020 0811  ? GFRNONAA 87 07/09/2015 0836  ? GFRAA 100 07/09/2015 0836  ? ?Lab Results  ?Component Value Date  ? CHOL 174 10/25/2017  ? HDL 38.90 (L) 10/25/2017  ? LDLCALC 114 (H) 10/25/2017  ? TRIG 108.0 10/25/2017  ? CHOLHDL 4 10/25/2017  ? ?Lab Results  ?Component Value Date  ? HGBA1C 5.2  10/25/2017  ? ?No results found for: VITAMINB12 ?No results found for: TSH ? ?ASSESSMENT AND PLAN ?41 y.o. year old male  has a past medical history of Depression, Migraine, and Seizures ([place]). here with: ? ?1.

## 2021-07-08 LAB — CBC WITH DIFFERENTIAL/PLATELET
Basophils Absolute: 0 10*3/uL (ref 0.0–0.2)
Basos: 1 %
EOS (ABSOLUTE): 0.1 10*3/uL (ref 0.0–0.4)
Eos: 2 %
Hematocrit: 42.5 % (ref 37.5–51.0)
Hemoglobin: 14.4 g/dL (ref 13.0–17.7)
Immature Grans (Abs): 0 10*3/uL (ref 0.0–0.1)
Immature Granulocytes: 0 %
Lymphocytes Absolute: 1.8 10*3/uL (ref 0.7–3.1)
Lymphs: 37 %
MCH: 29.6 pg (ref 26.6–33.0)
MCHC: 33.9 g/dL (ref 31.5–35.7)
MCV: 87 fL (ref 79–97)
Monocytes Absolute: 0.4 10*3/uL (ref 0.1–0.9)
Monocytes: 7 %
Neutrophils Absolute: 2.5 10*3/uL (ref 1.4–7.0)
Neutrophils: 53 %
Platelets: 255 10*3/uL (ref 150–450)
RBC: 4.86 x10E6/uL (ref 4.14–5.80)
RDW: 12.9 % (ref 11.6–15.4)
WBC: 4.9 10*3/uL (ref 3.4–10.8)

## 2021-07-08 LAB — COMPREHENSIVE METABOLIC PANEL
ALT: 16 IU/L (ref 0–44)
AST: 14 IU/L (ref 0–40)
Albumin/Globulin Ratio: 2 (ref 1.2–2.2)
Albumin: 4.5 g/dL (ref 4.0–5.0)
Alkaline Phosphatase: 55 IU/L (ref 44–121)
BUN/Creatinine Ratio: 10 (ref 9–20)
BUN: 11 mg/dL (ref 6–20)
Bilirubin Total: 0.6 mg/dL (ref 0.0–1.2)
CO2: 22 mmol/L (ref 20–29)
Calcium: 9.7 mg/dL (ref 8.7–10.2)
Chloride: 102 mmol/L (ref 96–106)
Creatinine, Ser: 1.12 mg/dL (ref 0.76–1.27)
Globulin, Total: 2.3 g/dL (ref 1.5–4.5)
Glucose: 91 mg/dL (ref 70–99)
Potassium: 4.4 mmol/L (ref 3.5–5.2)
Sodium: 142 mmol/L (ref 134–144)
Total Protein: 6.8 g/dL (ref 6.0–8.5)
eGFR: 86 mL/min/{1.73_m2} (ref >59)

## 2021-07-08 LAB — LAMOTRIGINE LEVEL: Lamotrigine Lvl: 7.1 ug/mL (ref 2.0–20.0)

## 2021-08-23 ENCOUNTER — Encounter: Payer: Self-pay | Admitting: Neurology

## 2021-08-23 ENCOUNTER — Telehealth: Payer: Self-pay | Admitting: *Deleted

## 2021-08-23 DIAGNOSIS — G4482 Headache associated with sexual activity: Secondary | ICD-10-CM

## 2021-08-23 DIAGNOSIS — G43909 Migraine, unspecified, not intractable, without status migrainosus: Secondary | ICD-10-CM

## 2021-08-23 NOTE — Telephone Encounter (Signed)
I called the patient back. Reports while having intercourse with his wife, he heard an extremely loud pop coming from the right side of his head. This was followed by one of the worst migraines he has ever suffered. This occurred Thursday evening. He also notes that he had profuse sweating to the point of having to get into the shower then sit outside to cool off. Tried two doses of sumatriptan and went to sleep. He woke up Friday with continued pain and blurred vision in his right eye. He tried to wait out the pain but eventually reached out to the on-call MD (Dr. Leta Baptist). He was instructed to go to urgent care but decided against it. Pain has slowly started to get better but not entirely gone. He has not tried any further doses of sumatriptan. He has been taking aspirin. Denies any active infections. No new medications. He has cut out all alcohol (says he was only having 1-2 drinks per week previously). He has also cut out his nicotine patch. He scheduled a mychart visit with Judson Roch on 08/25/21.

## 2021-08-23 NOTE — Telephone Encounter (Signed)
Patient called for on-call MD. Note from Dr. Marjory Lies:  Pls let patient know to go to urgent care for eval and tx. I will forward message to his neurologist for review on Monday.   Fyi. Pls check in with patient on Monday.

## 2021-08-23 NOTE — Telephone Encounter (Signed)
Phone note in Epic. 

## 2021-08-24 ENCOUNTER — Telehealth: Payer: Self-pay | Admitting: Neurology

## 2021-08-24 NOTE — Addendum Note (Signed)
Addended by: Suzzanne Cloud on: 08/24/2021 03:24 PM   Modules accepted: Orders

## 2021-08-24 NOTE — Telephone Encounter (Signed)
I spoke to patient and he is agreeable to come in for the ordered tests. He can come today or tomorrow. His son has surgery on Thursday. [place]. He can be reached at [phone].

## 2021-08-24 NOTE — Telephone Encounter (Signed)
The location doesn't matter, the insurance will not pay for both MRI and MRA on the same day they have to be done on separate days. One can be done tomorrow and one will have to be done after his vacation.

## 2021-08-24 NOTE — Telephone Encounter (Signed)
Let's change to CT Angio Head. We can cancel MRI/MRA. Thanks  Orders Placed This Encounter  Procedures   CT ANGIO HEAD W OR WO CONTRAST

## 2021-08-24 NOTE — Telephone Encounter (Signed)
Please call the patient, I am going to go ahead and order imaging MRI brain with and without contrast and MRA head so we can start the process of insurance approval. He had labs done 07/07/21 creatinine 1.12.   Orders Placed This Encounter  Procedures   MR BRAIN W WO CONTRAST   MR ANGIO HEAD WO CONTRAST

## 2021-08-24 NOTE — Telephone Encounter (Signed)
[employer] Auth: [id number] exp. 6/13/237/12/23 sent to GI

## 2021-08-24 NOTE — Telephone Encounter (Signed)
There isn't room today, he could come tomorrow but his insurance does not allow MRI and MRA to be done in the same day so he can do one or the other tomorrow

## 2021-08-24 NOTE — Addendum Note (Signed)
Addended by: Suzzanne Cloud on: 08/24/2021 12:48 PM   Modules accepted: Orders

## 2021-08-25 ENCOUNTER — Telehealth: Payer: BC Managed Care – PPO | Admitting: Neurology

## 2021-08-25 ENCOUNTER — Encounter: Payer: Self-pay | Admitting: Neurology

## 2021-08-25 DIAGNOSIS — G43909 Migraine, unspecified, not intractable, without status migrainosus: Secondary | ICD-10-CM | POA: Diagnosis not present

## 2021-08-25 DIAGNOSIS — G40209 Localization-related (focal) (partial) symptomatic epilepsy and epileptic syndromes with complex partial seizures, not intractable, without status epilepticus: Secondary | ICD-10-CM | POA: Diagnosis not present

## 2021-08-25 DIAGNOSIS — G4482 Headache associated with sexual activity: Secondary | ICD-10-CM | POA: Diagnosis not present

## 2021-08-25 NOTE — Progress Notes (Signed)
[facility]  Virtual Visit via Video Note  I connected with Tony Valentine on 08/25/21 at  3:15 PM EDT by a video enabled telemedicine application and verified that I am speaking with the correct person using two identifiers.  Location: Patient: at his home Provider: in the office    I discussed the limitations of evaluation and management by telemedicine and the availability of in person appointments. The patient expressed understanding and agreed to proceed.  History of Present Illness: [name] is a 41 year old male, with history of of complex partial seizure disorder and is currently on lamotrigine 150 twice a day. Last seizure was in November 2013.  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 His EEG was normal. He is tolerating his lamotrigine without side effects.   He also reported long history of chronic migraine headache, his typical migraine are left retro-orbital area severe pressure, only headache with associated light noise sensitivity, nauseous, lasting for few hours.   He now complains of increased frequency of migraine headaches, couple times each month, he has never tried triptan treatment in the past, he is using over-the-counter medication with partial relief, sleep is always helpful   Update January 01, 2020 SS: Here today for follow-up, no recurrent seizure, doing well on Lamictal 150 mg twice daily. Continues with migraines, 3-4 a month, near daily mild to moderate headache. Headaches usually located right or left occipital area or right frontal. Imitrex usually works well, sometimes has to repeat dose, with bad migraine, will take shower, lay  down, sleep. Not sleeping well, trouble falling asleep, only sleeping 5 hours at night. 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    Update July 01, 2020 SS: Tried nortriptyline at last visit for migraines, reported akathisia after taking once (felt hot, itchy, anxiety took over him, tingling feeling all over, muscles tightened up, legs kicking, lasted a few hours), went to ER, stopped the medication, there was no seizure, given fluid in ER.  Remains on Lamictal 150 mg twice daily, no recurrent seizures since last seen.  For migraines, takes Imitrex as needed, under good control.  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  Has lost 10 pounds, eating better, working out.   Update July 07, 2021 SS: Doing well, no recent seizures, remains on Lamictal 150 mg twice daily.  Migraines are under good control, on average 1 migraine monthly.  Only takes Imitrex if absolutely needed.  He is an Pharmacist, hospital, busy with work. Feels like getting better rest.  No changes to health.  Update 08/25/21 SS: Here today for virtual visit, had headache during intercourse last Thursday, felt pop to vertex/right parietal area, had severe pain radiating to right eye, right side of neck tightened. He took 2 Imitrex, no change, got in the shower, afterwards profuse sweating. This was not typical migraine, severe for next few day, Still has the pain sensation, is less now, 3/10 now. Reports some intermittent tingling to fingers, claims vision in right eye is blurry intermittently. Feels finger shoved into right skull base. Right shoulder is sore to touch. No headache  prior to onset. Never experienced anything like this before.    Observations/Objective: Well-appearing, is alert and oriented, speech is clear and concise, facial symmetry noted, moves arms and legs without difficulty,  gait is steady and intact, tandem gait is normal, Romberg is negative.  Assessment and Plan: 1.  Chronic migraine headache history 2.  Postcoital headache 3.  History of complex partial seizure  -Check CT angiogram head and neck with and without contrast, rule out aneurysm, bleeding given new onset, severe headache -During virtual visit, patient is well-appearing, headache is improving, but presentation is concerning, pain continues to linger -CMP 07/07/21 creatinine was 1.12, drink plenty of water before and after contrast, will ask they update I-stat creatinine at [facility] tomorrow before scans -Scheduled for imaging tomorrow, any worsening symptoms go to the ER immediately   Follow Up Instructions: Await scan results   I discussed the assessment and treatment plan with the patient. The patient was provided an opportunity to ask questions and all were answered. The patient agreed with the plan and demonstrated an understanding of the instructions.   The patient was advised to call back or seek an in-person evaluation if the symptoms worsen or if the condition fails to improve as anticipated.  Otila Kluver, DNP  [facility] [address] [place], [place] [address] [phone]

## 2021-08-26 ENCOUNTER — Ambulatory Visit
Admission: RE | Admit: 2021-08-26 | Discharge: 2021-08-26 | Disposition: A | Discharge status: Home / Self Care | Payer: BC Managed Care – PPO | Source: Ambulatory Visit | Attending: Neurology | Admitting: Neurology

## 2021-08-26 DIAGNOSIS — G4482 Headache associated with sexual activity: Secondary | ICD-10-CM

## 2021-08-26 MED ORDER — IOPAMIDOL (ISOVUE-370) INJECTION 76%
75.0000 mL | Freq: Once | INTRAVENOUS | Status: AC | PRN
  Administered 2021-08-26: 75 mL via INTRAVENOUS

## 2021-08-26 NOTE — Addendum Note (Signed)
Addended by: Glean Salvo on: 08/26/2021 07:48 AM   Modules accepted: Orders

## 2021-08-27 ENCOUNTER — Other Ambulatory Visit: Payer: Self-pay | Admitting: Neurology

## 2021-09-22 ENCOUNTER — Telehealth: Payer: BC Managed Care – PPO | Admitting: Neurology

## 2021-09-22 DIAGNOSIS — G43909 Migraine, unspecified, not intractable, without status migrainosus: Secondary | ICD-10-CM | POA: Diagnosis not present

## 2021-09-22 DIAGNOSIS — G40209 Localization-related (focal) (partial) symptomatic epilepsy and epileptic syndromes with complex partial seizures, not intractable, without status epilepticus: Secondary | ICD-10-CM

## 2021-09-22 NOTE — Progress Notes (Signed)
Assessment and Plan: Complex partial seizure Chronic migraine headache history Postcoital headache  CT angiogram of head and neck showed no significant abnormality, incidental finding of posterior fossa arachnoid, which has been stable over many years  He has no recurrent seizure, tolerating lamotrigine 150 mg daily, level was 7.1 in April 2023  Continue have frequent migraine headaches, I have suggested magnesium oxide 400 mg p.o. riboflavin 100 mg twice a day as preventive medications,  Imitrex 50 mg as needed for more severe headache, may combine with Aleve as needed  Return to clinic with nurse practitioner in 6 months with virtual visit    History of Present Illness: [name] is a 41 year old male, with history of of complex partial seizure disorder and is currently on lamotrigine 150 twice a day. Last seizure was in November 2013.  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 His EEG was normal. He is tolerating his lamotrigine without side effects.   He also reported long history of chronic migraine headache, his typical migraine are left retro-orbital area severe pressure, only headache with associated light noise sensitivity, nauseous, lasting for few hours.   He now complains of increased frequency of migraine headaches, couple times each month, he has never tried triptan treatment in the past, he is using over-the-counter medication with partial relief, sleep is always helpful   Virtual Visit via video UPDATE September 22 2021 I discussed the limitations of evaluation and management by telemedicine and the availability of in person appointments. The patient  expressed understanding and agreed to proceed  Location: Provider: [facility] office; Patient: Home  I connected with Tony Valentine  on September 22, 2021 by a video enabled telemedicine application and verified that I am speaking with the correct person using two identifiers.  UPDATED HiSTORY Patient is a concerned about his MRI findings, we personally reviewed CT angiogram of head and brain, which was essentially normal, in the report, there was a mention of posterior fossa arachnoid cyst, we have compared current CT findings with multiple previous CT head, there was no significant change, it is a benign variant  He is tolerating lamotrigine 150 mg twice a day well, there was no recurrent seizure  For frequent migraine headaches, he was given nortriptyline 10 mg, 15 hours after he took his nighttime nortriptyline, he complains of not feeling well, contributed to the side effect of nortriptyline, which is less likely  He continue has 1-2 headache each week, lateralized variable degree pounding headache, intense headache will be associated with light noise sensitivity, nauseous, blurry vision,  He only use Imitrex 50 mg as needed 1-2 times each months, prefer natural remedy,   Observations/Objective: Awake, alert, oriented to history taking and casual conversation,  Facial symmetric, moving 4 extremities without difficulty  REVIEW OF SYSTEMS: Full 14 system review of systems performed and notable only for those listed, all others are neg:  As above   ALLERGIES:      Allergies  Allergen Reactions   Amoxil [Amoxicillin] Hives and Shortness Of Breath   Penicillins Hives and Shortness Of Breath      Has patient had a PCN reaction causing immediate rash, facial/tongue/throat swelling, SOB or lightheadedness with hypotension:  Yes Has patient had a PCN reaction causing severe rash involving mucus membranes or skin necrosis: No Has patient had a PCN reaction that required hospitalization:  No Has patient had a PCN reaction occurring within the last 10 years: No If all of the above answers are "NO", then may proceed with Cephalosporin use.     Shellfish Allergy Anaphylaxis      HOME MEDICATIONS:       Outpatient Medications Prior to Visit  Medication Sig Dispense Refill   lamoTRIgine (LAMICTAL) 100 MG tablet TAKE 1.5 TABLETS (150 MG TOTAL) BY MOUTH 2 (TWO) TIMES DAILY. 270 tablet 3    No facility-administered medications prior to visit.       PAST MEDICAL HISTORY:     Past Medical History:  Diagnosis Date   Depression     Migraine     Seizures (HCC)      possible sz 09/19/16      PAST SURGICAL HISTORY:      Past Surgical History:  Procedure Laterality Date   REFRACTIVE SURGERY          FAMILY HISTORY:      Family History  Problem Relation Age of Onset   Arthritis Mother     Depression Mother     Alcohol abuse Father     Hypertension Father     Hyperlipidemia Father     Cancer Maternal Aunt     Cancer Maternal Uncle     Cancer Maternal Grandmother     Diabetes Maternal Grandmother     Alcohol abuse Sister     Depression Sister        SOCIAL HISTORY: Social History         Socioeconomic History   Marital status: Married      Spouse name: Tony Valentine   Number of children: 2   Years of education: 12   Highest education level: Not on file  Occupational History      Comment: Surveyor, mining strain: Not on file   Food insecurity      Worry: Not on file      Inability: Not on file   Transportation needs      Medical: Not on file      Non-medical: Not on file  Tobacco Use   Smoking status: Former Smoker      Types: Cigarettes   Smokeless tobacco: Former Neurosurgeon      Types: Chew  Substance and Sexual Activity   Alcohol use: Yes      Comment: every once in a while    Drug use: No   Sexual activity: Not on file  Lifestyle   Physical activity      Days per week: Not on file      Minutes per session: Not  on file   Stress: Not on file  Relationships   Social connections      Talks on phone: Not on file      Gets together: Not on file      Attends religious service: Not on file      Active member of club or organization: Not on file      Attends meetings of clubs or organizations: Not on file      Relationship status: Not on file   Intimate partner violence      Fear of current or ex partner: Not on file      Emotionally abused: Not on file  Physically abused: Not on file      Forced sexual activity: Not on file  Other Topics Concern   Not on file  Social History Narrative    Patient lives at home with his wife Tony Valentine) Patient works full time.    Right handed.    Bachelors in Chief Financial Officer. Works in Airline pilot.    Caffeine- two to four cups daily.    Patient has 2 children.         Total time spent reviewing the chart, obtaining history, examined patient, ordering tests, documentation, consultations and family, care coordination was 40 minutes

## 2021-09-22 NOTE — Patient Instructions (Signed)
Vit B2= Riboflavin 100mg  Magnesium oxide 400mg  twice a day  As migraine prevention

## 2021-09-23 ENCOUNTER — Encounter: Payer: Self-pay | Admitting: Neurology

## 2021-09-23 DIAGNOSIS — G43909 Migraine, unspecified, not intractable, without status migrainosus: Secondary | ICD-10-CM | POA: Insufficient documentation

## 2022-07-06 NOTE — Progress Notes (Unsigned)
   Virtual Visit via Video Note  I connected with Tony Valentine on 07/06/22 at  8:45 AM EDT by a video enabled telemedicine application and verified that I am speaking with the correct person using two identifiers.  Location: Patient: at his home  Provider: in the office    I discussed the limitations of evaluation and management by telemedicine and the availability of in person appointments. The patient expressed understanding and agreed to proceed.  History of Present Illness: 09/22/21 Dr. 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, we have compared current CT findings with multiple previous CT head, there was no significant change, it is a benign variant  He is tolerating lamotrigine 150 mg twice a day well, there was no recurrent seizure  For frequent migraine headaches, he was given nortriptyline 10 mg, 15 hours after he took his nighttime nortriptyline, he complains of not feeling well, contributed to the side effect of nortriptyline, which is less likely  He continue has 1-2 headache each week, lateralized variable degree pounding headache, intense headache will be associated with light noise sensitivity, nauseous, blurry vision,  He only use Imitrex 50 mg as needed 1-2 times each months, prefer natural remedy,  Today July 07, 2022 SS: Via VV, no seizures, remains on Lamictal 150 mg BID. Taking B2 with magnesium, Co q 10 helping with migraines. Has only need Imitrex once when ran out of vitamins. Health is doing well. Working 3 jobs, is busy,.    Observations/Objective: Via virtual visit, is alert and oriented, speech is clear and concise, facial symmetry noted, follows commands well, moves about freely  Assessment and Plan: 1.  Complex partial seizure 2.  Chronic migraine headache  history -Doing very well, migraines under good control, no recent seizures -Continue Lamictal 150 mg twice a day -Continue Imitrex 50 mg as needed for acute headache -Also on magnesium oxide up to 400 mg daily, riboflavin 100 mg twice daily as migraine preventative  Follow Up Instructions: 1 year MyChart video visit   I discussed the assessment and treatment plan with the patient. The patient was provided an opportunity to ask questions and all were answered. The patient agreed with the plan and demonstrated an understanding of the instructions.   The patient was advised to call back or seek an in-person evaluation if the symptoms worsen or if the condition fails to improve as anticipated.  Otila Kluver, DNP  [facility] [address] [place], [place] [address] [phone]

## 2022-07-07 ENCOUNTER — Telehealth (INDEPENDENT_AMBULATORY_CARE_PROVIDER_SITE_OTHER): Payer: BC Managed Care – PPO | Admitting: Neurology

## 2022-07-07 DIAGNOSIS — G43909 Migraine, unspecified, not intractable, without status migrainosus: Secondary | ICD-10-CM

## 2022-07-07 DIAGNOSIS — G40209 Localization-related (focal) (partial) symptomatic epilepsy and epileptic syndromes with complex partial seizures, not intractable, without status epilepticus: Secondary | ICD-10-CM

## 2022-07-07 MED ORDER — SUMATRIPTAN SUCCINATE 50 MG PO TABS: 50.0000 mg | ORAL_TABLET | ORAL | 3 refills | Status: AC | PRN

## 2022-07-07 MED ORDER — LAMOTRIGINE 100 MG PO TABS: ORAL_TABLET | ORAL | 3 refills | Status: DC

## 2022-07-07 NOTE — Patient Instructions (Signed)
Great to see you! I am glad you are doing well! Continue current medications, let me know if you need anything See you back in 1 year. Take Care :)

## 2023-06-23 ENCOUNTER — Other Ambulatory Visit: Payer: Self-pay | Admitting: Neurology

## 2023-07-13 ENCOUNTER — Telehealth: Admitting: Neurology

## 2023-07-13 ENCOUNTER — Encounter: Payer: Self-pay | Admitting: Neurology

## 2023-07-13 ENCOUNTER — Telehealth: Payer: BC Managed Care – PPO | Admitting: Neurology

## 2023-07-13 DIAGNOSIS — Z8669 Personal history of other diseases of the nervous system and sense organs: Secondary | ICD-10-CM | POA: Diagnosis not present

## 2023-07-13 DIAGNOSIS — G40209 Localization-related (focal) (partial) symptomatic epilepsy and epileptic syndromes with complex partial seizures, not intractable, without status epilepticus: Secondary | ICD-10-CM | POA: Diagnosis not present

## 2023-07-13 NOTE — Patient Instructions (Addendum)
 Check routine EEG.  Then plan to proceed with ambulatory prolonged EEG at home.  I recommend remaining on Lamictal  for seizure prevention.  Please call for any seizure-like activity.  I will follow-up once EEG results. If we restart Lamictal , you will need to titrate up again slowly

## 2023-07-13 NOTE — Progress Notes (Signed)
 Virtual Visit via Video Note  I connected with Tony Valentine on 07/13/23 at  2:15 PM EDT by a video enabled telemedicine application and verified that I am speaking with the correct person using two identifiers.  Location: Patient: at his home  Provider: in the office    I discussed the limitations of evaluation and management by telemedicine and the availability of in person appointments. The patient expressed understanding and agreed to proceed.  History of Present Illness: 09/22/21 Dr. 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, we have compared current CT findings with multiple previous CT head, there was no significant change, it is a benign variant  He is tolerating lamotrigine  150 mg twice a day well, there was no recurrent seizure  For frequent migraine headaches, he was given nortriptyline  10 mg, 15 hours after he took his nighttime nortriptyline , he complains of not feeling well, contributed to the side effect of nortriptyline , which is less likely  He continue has 1-2 headache each week, lateralized variable degree pounding headache, intense headache will be associated with light noise sensitivity, nauseous, blurry vision,  He only use Imitrex  50 mg as needed 1-2 times each months, prefer natural remedy,  Today July 07, 2022 SS: Via VV, no seizures, remains on Lamictal  150 mg BID. Taking B2 with magnesium, Co q 10 helping with migraines. Has only need Imitrex  once when ran out of vitamins. Health is doing well. Working 3 jobs, is busy,.   Update 07/13/23 SS: via VV, he stopped his Lamictal  for 2 months now, he went on a fast, was going through a spiritual journey, without medication he felt clearer than ever. Seizures as a child, generalized seizure events, one time hospitalized for 10 days.  Returned 12 years ago, would have chest pain, sweating, kept going to the ER. 2013 generalized seizure event, woke up under the bed, oral injury, tried few different seizure medication, got to Lamictal , was increased to 150 mg BID, due to zoning out spells, near syncope. Without Lamictal  feels better without it, energy is better, focus is better. A lot more meditation. In the past treated with phenobarbital, he thinks Keppra? Natural remedies.    Observations/Objective: Via virtual visit, is alert and oriented, speech is clear and concise, facial symmetry noted, follows commands well, moves about freely  Assessment and Plan: 1.  Complex partial seizure 2.  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 He is at risk for recurrent seizure off medication. If we restart, he will need titration. -Call for seizure activity   Follow Up Instructions: determine once EEG results   I discussed the assessment and treatment plan with the patient. The patient was provided an opportunity to ask questions and all were answered. The patient agreed with the plan and demonstrated an understanding of the instructions.   The patient was advised to call back or seek an in-person evaluation if the symptoms worsen or if the condition fails to improve as anticipated.  Cortland Ding, DNP  [facility] [address] [place], [place] [address] [phone]  Addendum: Discussed case with nurse practitioner Kathern Lobosco, patient had a history of complex partial seizure, previous MRI showed left hippocampal  atrophy, left mesial temporal lobe sclerosis, he is at high risk of recurrent seizure, last reported seizure activity was more than 10 years ago but was on antiepileptic medications,  I would suggest him keep on low-dose of lamotrigine ,  Understand he already had extensive discussion with his nurse practitioner during office visit, adamant on stopping medications,

## 2023-07-13 NOTE — Telephone Encounter (Signed)
 Pt called in response to message sent by nurse in regards to EEG. Want to know when to schedule EEG.

## 2023-07-13 NOTE — Progress Notes (Deleted)
   Virtual Visit via Video Note  I connected with Lorelie Rohrer on 07/13/23 at  7:45 AM EDT by a video enabled telemedicine application and verified that I am speaking with the correct person using two identifiers.  Location: Patient: at his home  Provider: in the office    I discussed the limitations of evaluation and management by telemedicine and the availability of in person appointments. The patient expressed understanding and agreed to proceed.  History of Present Illness: 09/22/21 Dr. 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, we have compared current CT findings with multiple previous CT head, there was no significant change, it is a benign variant  He is tolerating lamotrigine  150 mg twice a day well, there was no recurrent seizure  For frequent migraine headaches, he was given nortriptyline  10 mg, 15 hours after he took his nighttime nortriptyline , he complains of not feeling well, contributed to the side effect of nortriptyline , which is less likely  He continue has 1-2 headache each week, lateralized variable degree pounding headache, intense headache will be associated with light noise sensitivity, nauseous, blurry vision,  He only use Imitrex  50 mg as needed 1-2 times each months, prefer natural remedy,  Today July 07, 2022 SS: Via VV, no seizures, remains on Lamictal  150 mg BID. Taking B2 with magnesium, Co q 10 helping with migraines. Has only need Imitrex  once when ran out of vitamins. Health is doing well. Working 3 jobs, is busy,.   Update 07/13/23 SS:    Observations/Objective: Via virtual visit, is alert and oriented, speech is clear and concise, facial symmetry noted, follows commands well, moves about freely  Assessment and Plan: 1.  Complex partial seizure 2.  Chronic  migraine headache history -Doing very well, migraines under good control, no recent seizures -Continue Lamictal  150 mg twice a day -Continue Imitrex  50 mg as needed for acute headache -Also on magnesium oxide up to 400 mg daily, riboflavin 100 mg twice daily as migraine preventative  Follow Up Instructions: 1 year MyChart video visit   I discussed the assessment and treatment plan with the patient. The patient was provided an opportunity to ask questions and all were answered. The patient agreed with the plan and demonstrated an understanding of the instructions.   The patient was advised to call back or seek an in-person evaluation if the symptoms worsen or if the condition fails to improve as anticipated.  Cortland Ding, DNP  [facility] [address] [place], [place] [address] [phone]

## 2023-07-17 NOTE — Telephone Encounter (Signed)
 Patient scheduled for EEG for 07/18/23 at 9:30am

## 2023-07-18 ENCOUNTER — Ambulatory Visit: Admitting: Neurology

## 2023-07-18 DIAGNOSIS — G40209 Localization-related (focal) (partial) symptomatic epilepsy and epileptic syndromes with complex partial seizures, not intractable, without status epilepticus: Secondary | ICD-10-CM

## 2023-07-20 ENCOUNTER — Telehealth: Payer: Self-pay | Admitting: Neurology

## 2023-07-20 DIAGNOSIS — G40209 Localization-related (focal) (partial) symptomatic epilepsy and epileptic syndromes with complex partial seizures, not intractable, without status epilepticus: Secondary | ICD-10-CM

## 2023-07-20 NOTE — Telephone Encounter (Signed)
 I called the patient, we reviewed EEG mildly abnormal showing evidence of T7, F7 sharp transients indicating left frontal temporal focal irritability.  Recommend we restart Lamictal  titrating dose.  He wants to discuss with his wife.  He is aware he is at risk of breakthrough seizure off medication.  He may pursue 2nd opinion. Can we send images of EEG abnormality through my chart?

## 2023-07-20 NOTE — Procedures (Signed)
   HISTORY: 43 year old male with history of partial seizure, left temporal lobe sclerosis, stopped antipeptic medication recently,  TECHNIQUE:  This is a routine 16 channel EEG recording with one channel devoted to a limited EKG recording.  It was performed during wakefulness, drowsiness and asleep.  Hyperventilation and photic stimulation were performed as activating procedures.  There are minimum muscle and movement artifact noted.  Upon maximum arousal, posterior dominant waking rhythm consistent of rhythmic alpha range activity. Activities are symmetric over the bilateral posterior derivations and attenuated with eye opening.  Photic stimulation did not alter the tracing.  Hyperventilation produced mild/moderate buildup with higher amplitude and the slower activities noted.  During EEG recording, patient developed drowsiness and no deeper stage of sleep was recorded.  During EEG recording, there was no epileptiform discharge, by the end of the tracing, there was occasionally T7, F7 sharp transient,  EKG demonstrate normal sinus rhythm.  CONCLUSION: This is a mild abnormal EEG.  There is electrodiagnostic evidence of T7, F7 sharp transient, indicating left frontal temporal focal irritability.  Kaileah Shevchenko, M.D. Ph.D.  [facility] [address] [place], [place] [address] Phone: [phone] Fax:      [phone]

## 2023-07-24 NOTE — Telephone Encounter (Signed)
 Tony Valentine

## 2023-07-25 NOTE — Addendum Note (Signed)
 Addended by: Pio Eatherly on: 07/25/2023 03:42 PM   Modules accepted: Orders

## 2023-07-25 NOTE — Telephone Encounter (Signed)
 Faxed signed order below to [facility] to get pt scheduled.

## 2023-07-25 NOTE — Telephone Encounter (Signed)
 Order for 72 hr EEG via [facility] filled out, pending SS,NP signature

## 2023-07-25 NOTE — Telephone Encounter (Signed)
 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   He has stopped Lamotrigine  150mg  bid in Feb 2025, because he has not had recurrent seizure since 2013, now adopting more healthier life style.  The 1st week he quit lamotrigine , he felt terrible,  Now he feel better, more clearheaded, dream now, meditating, also on healthier diet, would like to stay off AEDs.  I have discussed with patient the potential risk of recurrent seizure,   Agree on 72 hours video EEG monitoring.  Orders Placed This Encounter  Procedures   AMBULATORY EEG    Standing Status:   Future    Expiration Date:   07/24/2024    Scheduling Instructions:     72 hours video EEG    Where should this test be performed:   [facility]

## 2024-04-07 IMAGING — CT CT ANGIO HEAD
2 of 7 series · 6 of 27 positions shown · non-contrast
Comparison: CT head without contrast 09/19/2016

CLINICAL DATA: Headache, chronic, new features or increased
frequency.

EXAM:
CT ANGIOGRAPHY HEAD AND NECK
TECHNIQUE: Multidetector CT imaging of the head and neck was performed using
the standard protocol during bolus administration of intravenous
contrast. Multiplanar CT image reconstructions and MIPs were
obtained to evaluate the vascular anatomy. Carotid stenosis
measurements (when applicable) are obtained utilizing NASCET
criteria, using the distal internal carotid diameter as the
denominator.

[Series 8: sagittal · sagittal · 0.29mm/px · 1 of 68 slices shown]
[im 34/68  soft-tissue]
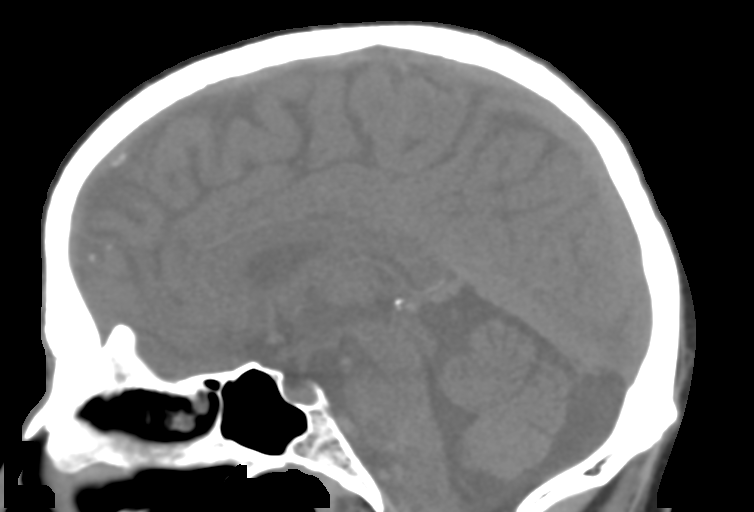

[Series 12: cta neck axial · axial · 0.39mm/px · z∈[-346,-102]mm · 5 of 366 slices shown]
[im 61/366  soft-tissue]
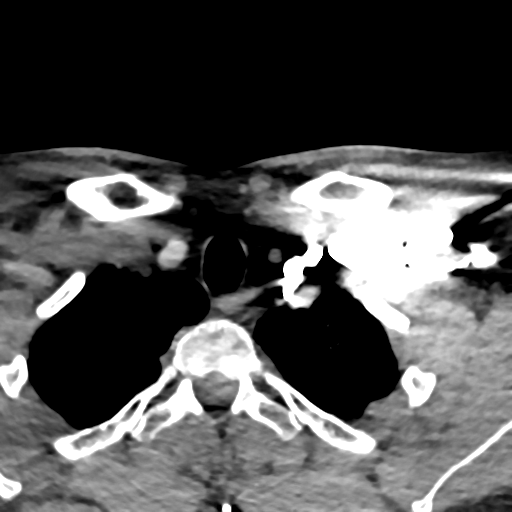
[im 122/366  bone]
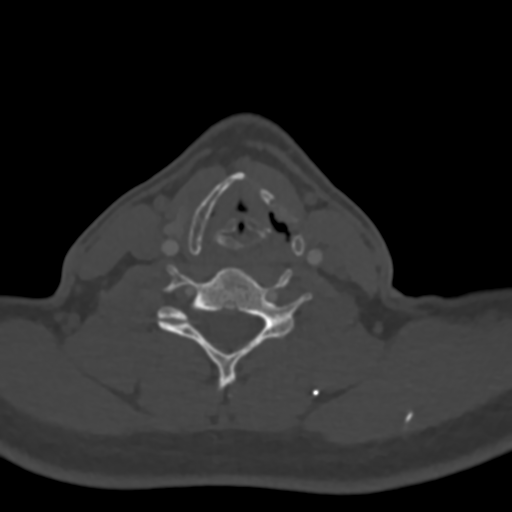
[im 183/366  soft-tissue]
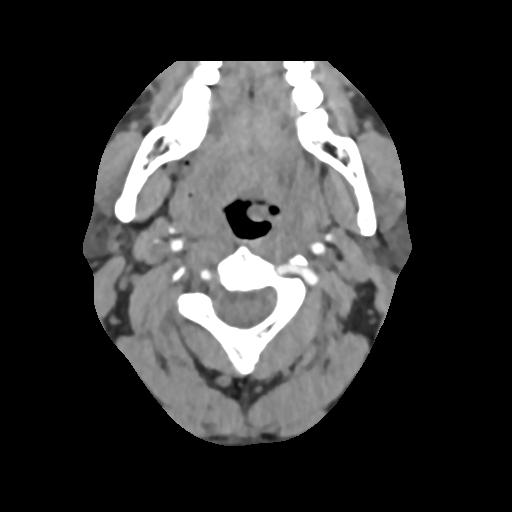
[im 244/366  bone]
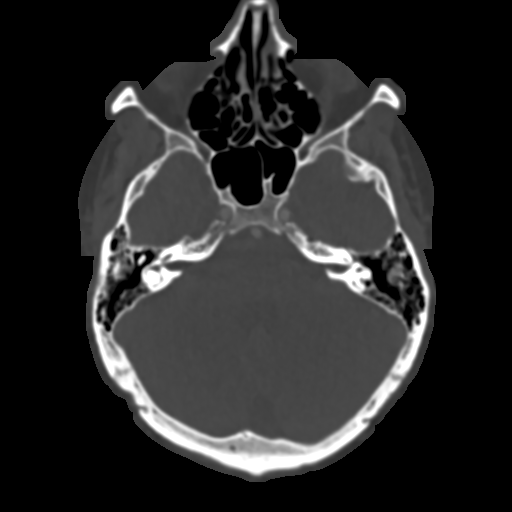
[im 305/366  soft-tissue]
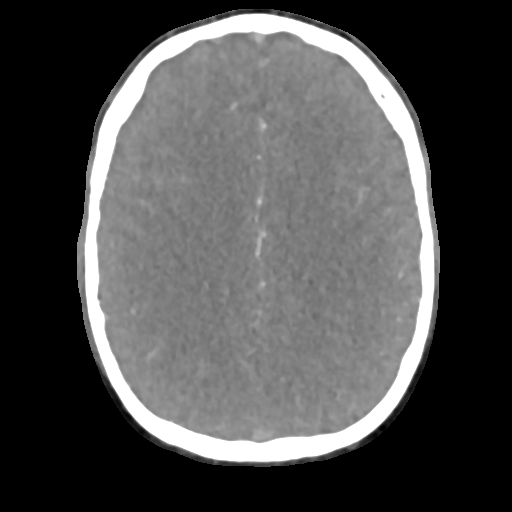

[6 of 27 positions shown; findings below may reference images not displayed]

RADIATION DOSE REDUCTION: This exam was performed according to the
departmental dose-optimization program which includes automated
exposure control, adjustment of the mA and/or kV according to
patient size and/or use of iterative reconstruction technique.

CONTRAST:  75mL [id number] IOPAMIDOL ([id number]) INJECTION 76%
FINDINGS: CT HEAD FINDINGS

Brain: No acute infarct, hemorrhage, or mass lesion is present. No
significant white matter lesions are present. The ventricles are of
normal size.

Posterior fossa arachnoid cyst again noted. Brainstem and cerebellum
are otherwise within normal limits.

Vascular: No hyperdense vessel or unexpected calcification.

Delayed postcontrast images demonstrate no pathologic enhancement.

Skull: Calvarium is intact. No focal lytic or blastic lesions are
present. No significant extracranial soft tissue lesion is present.

Sinuses: The paranasal sinuses and mastoid air cells are clear.

Orbits: The globes and orbits are within normal limits.

Review of the MIP images confirms the above findings

CTA NECK FINDINGS

Aortic arch: Standard branching. Imaged portion shows no evidence of
aneurysm or dissection. No significant stenosis of the major arch
vessel origins.

Right carotid system: Right common carotid artery is within normal
limits. Bifurcation is unremarkable. Cervical right ICA is within
normal limits.

Left carotid system: The left common carotid artery is within normal
limits. The bifurcation is unremarkable. Cervical left ICA is
normal.

Vertebral arteries: The vertebral arteries are codominant. Both
vertebral arteries originate from the subclavian arteries without
significant stenosis.

Skeleton: Unremarkable

Other neck: Soft tissues the neck are otherwise unremarkable.
Salivary glands are within normal limits. Thyroid is normal. No
significant adenopathy is present. No focal mucosal or submucosal
lesions are present.

Upper chest: The lung apices are clear. Thoracic inlet is within
normal limits.

Review of the MIP images confirms the above findings

CTA HEAD FINDINGS

Anterior circulation: The internal carotid arteries are within
normal limits from the skull base through the ICA termini. The A1
and M1 segments are normal. The anterior communicating artery is
patent. MCA bifurcations are within normal limits. The ACA and MCA
branch vessels are normal.

Posterior circulation: The vertebral arteries are codominant. PICA
origins are visualized and normal. Vertebrobasilar junction is
normal. The basilar artery is normal. Both posterior cerebral
arteries originate from basilar tip. The PCA branch vessels are
normal bilaterally.

Venous sinuses: The dural sinuses are patent. The straight sinus
deep cerebral veins are intact. Cortical veins are within normal
limits. No significant vascular malformation is evident.

Anatomic variants: None

Review of the MIP images confirms the above findings
IMPRESSION: 1. Normal variant CTA Circle of Willis without significant proximal
stenosis, aneurysm, or branch vessel occlusion.
2. Normal CTA of the neck.
